# Patient Record
Sex: Male | Born: 1950 | Race: White | Hispanic: No | Marital: Married
Health system: Midwestern US, Academic
[De-identification: ages and names within clinical notes are randomized; demographics above are authoritative.]

---

## 2017-02-19 ENCOUNTER — Encounter: Admit: 2017-02-19 | Discharge: 2017-02-20

## 2017-04-11 ENCOUNTER — Encounter: Admit: 2017-04-11 | Discharge: 2017-04-11

## 2017-04-11 ENCOUNTER — Encounter: Admit: 2017-04-11 | Discharge: 2017-04-12

## 2017-04-11 LAB — COMPREHENSIVE METABOLIC PANEL
Lab: 0.8
Lab: 18 — ABNORMAL HIGH (ref 60–99)
Lab: 29
Lab: 3.7
Lab: 42 — ABNORMAL HIGH (ref 7–39)
Lab: 58 — ABNORMAL LOW (ref >60)
Lab: 59
Lab: 7.5
Lab: 8
Lab: 9
Lab: >60

## 2017-04-11 LAB — MAGNESIUM: Lab: 1.9

## 2017-04-11 LAB — BNP (B-TYPE NATRIURETIC PEPTI)

## 2017-04-11 LAB — TROPONIN-I: Lab: 3.5 — ABNORMAL HIGH (ref 0.00–0.04)

## 2017-04-13 ENCOUNTER — Encounter: Admit: 2017-04-13 | Discharge: 2017-04-13 | Payer: MEDICARE

## 2017-04-13 ENCOUNTER — Inpatient Hospital Stay
Admit: 2017-04-14 | Discharge: 2017-04-15 | Disposition: A | Discharge status: Home / Self Care | Payer: MEDICARE | Source: Other Acute Inpatient Hospital | Attending: Cardiovascular Disease | Admitting: Cardiovascular Disease

## 2017-04-13 DIAGNOSIS — I771 Stricture of artery: ICD-10-CM

## 2017-04-13 DIAGNOSIS — I25110 Atherosclerotic heart disease of native coronary artery with unstable angina pectoris: ICD-10-CM

## 2017-04-13 LAB — CBC
Lab: 11 — ABNORMAL HIGH (ref 4.0–10.8)
Lab: 14 — ABNORMAL HIGH (ref 60–99)
Lab: 33 — ABNORMAL LOW (ref 40.0–50.0)

## 2017-04-13 LAB — BASIC METABOLIC PANEL
Lab: 1.2 — ABNORMAL HIGH (ref 0.40–1.10)
Lab: 136 — ABNORMAL LOW (ref 4.60–6.20)

## 2017-04-13 NOTE — Progress Notes
Hx CAD/bypass  Presented to [facility]/cardiac cath severe disease/interventional cardiologist recommended a specialized stent for his disease and pt's daughter has some connection with [facility] and they requested to be transferred to have procedure done at [facility].   Family request.   Med surg with tele

## 2017-04-14 ENCOUNTER — Encounter: Admit: 2017-04-14 | Discharge: 2017-04-14 | Payer: MEDICARE

## 2017-04-14 DIAGNOSIS — I739 Peripheral vascular disease, unspecified: ICD-10-CM

## 2017-04-14 DIAGNOSIS — I5022 Chronic systolic (congestive) heart failure: ICD-10-CM

## 2017-04-14 DIAGNOSIS — I10 Essential (primary) hypertension: ICD-10-CM

## 2017-04-14 DIAGNOSIS — I2510 Atherosclerotic heart disease of native coronary artery without angina pectoris: ICD-10-CM

## 2017-04-14 DIAGNOSIS — E785 Hyperlipidemia, unspecified: ICD-10-CM

## 2017-04-14 DIAGNOSIS — Z95810 Presence of automatic (implantable) cardiac defibrillator: ICD-10-CM

## 2017-04-14 DIAGNOSIS — I255 Ischemic cardiomyopathy: ICD-10-CM

## 2017-04-14 DIAGNOSIS — I5020 Unspecified systolic (congestive) heart failure: ICD-10-CM

## 2017-04-14 DIAGNOSIS — E119 Type 2 diabetes mellitus without complications: ICD-10-CM

## 2017-04-14 LAB — PTT (APTT)
Lab: 24 s — ABNORMAL LOW (ref 24.0–36.5)
Lab: 32 s (ref 24.0–36.5)

## 2017-04-14 LAB — BNP (B-TYPE NATRIURETIC PEPTI): Lab: 942 pg/mL — ABNORMAL HIGH (ref 0–100)

## 2017-04-14 LAB — PROTIME INR (PT): Lab: 1.2 M/UL — ABNORMAL LOW (ref 0.8–1.2)

## 2017-04-14 LAB — TROPONIN-I
Lab: 1.1 ng/mL — ABNORMAL HIGH (ref 0.0–0.05)
Lab: 0.7 ng/mL — ABNORMAL HIGH (ref 0.0–0.05)
Lab: 0.5 ng/mL — ABNORMAL HIGH (ref 0.0–0.05)

## 2017-04-14 LAB — COMPREHENSIVE METABOLIC PANEL
Lab: 1.1 mg/dL (ref 0.4–1.24)
Lab: 1.3 mg/dL — ABNORMAL HIGH (ref 0.3–1.2)
Lab: 10 10*3/uL — ABNORMAL HIGH (ref 3–12)
Lab: 136 MMOL/L — ABNORMAL LOW (ref 137–147)
Lab: 14 U/L (ref 7–40)
Lab: 21 U/L (ref 7–56)
Lab: 26 MMOL/L — ABNORMAL HIGH (ref 21–30)
Lab: 4 g/dL (ref 3.5–5.0)
Lab: 50 U/L (ref 25–110)
Lab: 7.2 g/dL (ref 6.0–8.0)
Lab: 9.3 mg/dL (ref 8.5–10.6)
Lab: >60 mL/min (ref >60)
Lab: >60 mL/min (ref >60)

## 2017-04-14 LAB — POC GLUCOSE: Lab: 232 mg/dL — ABNORMAL HIGH (ref 70–100)

## 2017-04-14 LAB — CBC AND DIFF: Lab: 11 10*3/uL — ABNORMAL HIGH (ref 4.5–11.0)

## 2017-04-14 LAB — PHOSPHORUS: Lab: 2.6 mg/dL (ref 2.0–4.5)

## 2017-04-14 LAB — MAGNESIUM: Lab: 1.8 mg/dL — ABNORMAL LOW (ref 1.6–2.6)

## 2017-04-14 MED ORDER — MELATONIN 3 MG PO TAB
3 mg | Freq: Every evening | ORAL | 0 refills | Status: DC | PRN
Start: 2017-04-14 — End: 2017-04-16
  Administered 2017-04-14: 08:00:00 3 mg via ORAL

## 2017-04-14 MED ORDER — CLOPIDOGREL 75 MG PO TAB
75 mg | Freq: Every day | ORAL | 0 refills | Status: DC
Start: 2017-04-14 — End: 2017-04-16
  Administered 2017-04-14 – 2017-04-15 (×2): 75 mg via ORAL

## 2017-04-14 MED ORDER — HEPARIN (PORCINE) 1,000 UNIT/ML IJ SOLN
20-40 [IU]/kg | INTRAVENOUS | 0 refills | Status: DC
Start: 2017-04-14 — End: 2017-04-15

## 2017-04-14 MED ORDER — ASPIRIN 81 MG PO CHEW
243 mg | Freq: Once | ORAL | 0 refills | Status: CP
Start: 2017-04-14 — End: ?
  Administered 2017-04-14: 18:00:00 243 mg via ORAL

## 2017-04-14 MED ORDER — ATORVASTATIN 40 MG PO TAB
40 mg | Freq: Every evening | ORAL | 0 refills | Status: DC
Start: 2017-04-14 — End: 2017-04-16
  Administered 2017-04-15: 07:00:00 40 mg via ORAL

## 2017-04-14 MED ORDER — ISOSORBIDE MONONITRATE 30 MG PO TB24
30 mg | Freq: Two times a day (BID) | ORAL | 0 refills | Status: DC
Start: 2017-04-14 — End: 2017-04-16
  Administered 2017-04-14 – 2017-04-15 (×3): 30 mg via ORAL

## 2017-04-14 MED ORDER — ASPIRIN 81 MG PO CHEW
81 mg | Freq: Every day | ORAL | 0 refills | Status: DC
Start: 2017-04-14 — End: 2017-04-16
  Administered 2017-04-14 – 2017-04-15 (×2): 81 mg via ORAL

## 2017-04-14 MED ORDER — AMIODARONE IN DEXTROSE,ISO-OSM 360 MG/200 ML (1.8 MG/ML) IV SOLN
0.5-1 mg/min | INTRAVENOUS | 0 refills | Status: DC
Start: 2017-04-14 — End: 2017-04-15
  Administered 2017-04-14 (×2): 0.5 mg/min via INTRAVENOUS

## 2017-04-14 MED ORDER — CARVEDILOL 12.5 MG PO TAB
12.5 mg | Freq: Two times a day (BID) | ORAL | 0 refills | Status: DC
Start: 2017-04-14 — End: 2017-04-16
  Administered 2017-04-14 – 2017-04-15 (×3): 12.5 mg via ORAL

## 2017-04-14 MED ORDER — FUROSEMIDE 10 MG/ML IJ SOLN
40 mg | Freq: Once | INTRAVENOUS | 0 refills | Status: CP
Start: 2017-04-14 — End: ?
  Administered 2017-04-14: 12:00:00 40 mg via INTRAVENOUS

## 2017-04-14 MED ORDER — ALBUTEROL SULFATE 90 MCG/ACTUATION IN HFAA
2 | RESPIRATORY_TRACT | 0 refills | Status: DC | PRN
Start: 2017-04-14 — End: 2017-04-15
  Administered 2017-04-15: 08:00:00 2 via RESPIRATORY_TRACT

## 2017-04-14 MED ORDER — MAGNESIUM SULFATE IN D5W 1 GRAM/100 ML IV PGBK
1 g | Freq: Once | INTRAVENOUS | 0 refills | Status: CP
Start: 2017-04-14 — End: ?
  Administered 2017-04-14: 13:00:00 1 g via INTRAVENOUS

## 2017-04-14 MED ORDER — HEPARIN (PORCINE) IN 5 % DEX 20,000 UNIT/500 ML (40 UNIT/ML) IV SOLP
0-2000 [IU]/h | INTRAVENOUS | 0 refills | Status: DC
Start: 2017-04-14 — End: 2017-04-15
  Administered 2017-04-14: 08:00:00 1000 [IU]/h via INTRAVENOUS

## 2017-04-14 MED ORDER — INSULIN ASPART 100 UNIT/ML SC FLEXPEN
0-7 [IU] | Freq: Before meals | SUBCUTANEOUS | 0 refills | Status: DC
Start: 2017-04-14 — End: 2017-04-16
  Administered 2017-04-14: 15:00:00 3 [IU] via SUBCUTANEOUS

## 2017-04-14 MED ORDER — POTASSIUM CHLORIDE 20 MEQ PO TBTQ
40 meq | Freq: Once | ORAL | 0 refills | Status: CP
Start: 2017-04-14 — End: ?
  Administered 2017-04-14: 08:00:00 40 meq via ORAL

## 2017-04-14 NOTE — Progress Notes
I have reviewed the notes, assessments, and/or procedures performed by Britney RN and concur with her documentation unless otherwise noted.

## 2017-04-14 NOTE — Progress Notes
pt.off unit in cath.lab

## 2017-04-14 NOTE — Progress Notes
Patient arrived to room # HC404 via cart accompanied by transport. Patient transferred to the bed with assistance. Bedside safety checks completed. Initial patient assessment completed, refer to flowsheet for details. Admission skin assessment completed by:   Marcheta Grammesess Hong Timm, RN  Deatra CanterBrandi Kile, RN    Pressure Injury Present on Hospital Admission (within 24 hours): No    1. Occiput: No  2. Ear: No  3. Scapula: No  4. Spinous Process: No  5. Shoulder: No  6. Elbow: No  7. Iliac Crest: No  8. Sacrum/Coccyx: No  9. Ischial Tuberosity: No  10. Trochanter: No  11. Knee: No  12. Malleolus: No  13. Heel: No  14. Toes: No  15. Assessed for device associated injury Yes  16. Nursing Nutrition Assessment Completed Yes    See Doc Flowsheet for additional wound details.     INTERVENTIONS:     N/A

## 2017-04-14 NOTE — Case Management (ED)
Case Management Progress Note    NAME:Jack Tenny CrawRoss                          MRN: [id number]              DOB:11/06/1950          AGE: 66 y.o.  ADMISSION DATE: 04/14/2017             DAYS ADMITTED: LOS: 0 days      Todays Date: 04/14/2017    Plan  *LHC today    Interventions  ? Support      ? Info or Referral      ? Discharge Planning   Discharge Planning: Other    *Patient was off the floor when this RN CM went to the room to assess the patient. This RN CM will try again later.     ? Medication Needs      ? Financial      ? Legal      ? Other        Disposition  ? Expected Discharge Date       ? Transportation        ? Discharge Disposition           Durable Medical Equipment      No service has been selected for the patient.      [facility] [facility]      No service has been selected for the patient.      [facility]      No service has been selected for the patient.      [facility]      No service has been selected for the patient.        Verdie ShireAnna Jana Swartzlander RN, BSN, MSN  Integrated Nursing Case Manager  Office [phone] M-F 8-5 pm

## 2017-04-14 NOTE — Progress Notes
RT Adult Assessment Note    NAME:William Hurley             MRN: [id number]             DOB:04/15/1951          AGE: 66 y.o.  ADMISSION DATE: 04/14/2017             DAYS ADMITTED: LOS: 0 days    RT Treatment Plan:  Protocol Plan: Medications  Albuterol: MDI PRN         Additional Comments:  Impressions of the patient: Awake,  talking to everyone  Intervention(s)/outcome(s): none  Patient education that was completed: none  Recommendations to the care team: none    Vital Signs:  Pulse: Pulse: 88  RR: Respirations: 16 PER MINUTE  SpO2: SpO2: 97 %  O2 Device:    Liter Flow:    O2%: O2 Percent: 21 %  Breath Sounds: All Breath Sounds: Clear (implies normal)  Respiratory Effort: Respiratory Effort: Non-Labored

## 2017-04-14 NOTE — Progress Notes
Patient arrived on unit via bed accompanied by transport. Patient transferred to the chair without assistance. Frailty score equals 5  Assessment completed, refer to flowsheet for details. Orders released, reviewed, and implemented as appropriate. Oriented to surroundings, call light within reach. Plan of care reviewed.  Will continue to monitor and assess.

## 2017-04-14 NOTE — Progress Notes
Assumed care at 0045.    No acute events overnight.  Assessments completed and documented per flowsheet.    A&Ox4,  VSS per pt trend,  tolerating RA, denies SOA,  V Paced on tele overnight.  Pain well controlled via current regimen.  Denies N/V.  Surgical incisions CDI.  Heparin and Amiodarone gtt infusing per orders (see MAR).  BM PTA,  Adequate UOP overnight.  HFR bundle in place.  Up x1.  Pt uses call light appropriately.  NPO for procedure 04/14/17 and CHG prepped.    No other needs voiced at this time.  Call light within reach.  Will continue to monitor.

## 2017-04-15 ENCOUNTER — Encounter: Admit: 2017-04-15 | Discharge: 2017-04-15 | Payer: MEDICARE

## 2017-04-15 ENCOUNTER — Inpatient Hospital Stay: Admit: 2017-04-14 | Discharge: 2017-04-14 | Payer: MEDICARE

## 2017-04-15 DIAGNOSIS — I214 Non-ST elevation (NSTEMI) myocardial infarction: Principal | ICD-10-CM

## 2017-04-15 DIAGNOSIS — I255 Ischemic cardiomyopathy: Secondary | ICD-10-CM

## 2017-04-15 DIAGNOSIS — I2510 Atherosclerotic heart disease of native coronary artery without angina pectoris: ICD-10-CM

## 2017-04-15 DIAGNOSIS — Z7984 Long term (current) use of oral hypoglycemic drugs: ICD-10-CM

## 2017-04-15 DIAGNOSIS — E1151 Type 2 diabetes mellitus with diabetic peripheral angiopathy without gangrene: ICD-10-CM

## 2017-04-15 DIAGNOSIS — I5023 Acute on chronic systolic (congestive) heart failure: ICD-10-CM

## 2017-04-15 DIAGNOSIS — I472 Ventricular tachycardia: ICD-10-CM

## 2017-04-15 DIAGNOSIS — Z95810 Presence of automatic (implantable) cardiac defibrillator: ICD-10-CM

## 2017-04-15 DIAGNOSIS — E785 Hyperlipidemia, unspecified: ICD-10-CM

## 2017-04-15 DIAGNOSIS — Z951 Presence of aortocoronary bypass graft: ICD-10-CM

## 2017-04-15 DIAGNOSIS — I110 Hypertensive heart disease with heart failure: ICD-10-CM

## 2017-04-15 LAB — POC GLUCOSE
Lab: 126 mg/dL — ABNORMAL HIGH (ref 70–100)
Lab: 173 mg/dL — ABNORMAL HIGH (ref 70–100)

## 2017-04-15 LAB — COMPREHENSIVE METABOLIC PANEL: Lab: 136 MMOL/L — ABNORMAL LOW (ref >60)

## 2017-04-15 LAB — CBC AND DIFF: Lab: 10 K/UL — ABNORMAL LOW (ref 4.5–11.0)

## 2017-04-15 LAB — POC ACTIVATED CLOTTING TIME
Lab: 256 s
Lab: 276 s
Lab: 163 s

## 2017-04-15 MED ORDER — AMIODARONE 200 MG PO TAB
400 mg | Freq: Two times a day (BID) | ORAL | 0 refills | Status: DC
Start: 2017-04-15 — End: 2017-04-16
  Administered 2017-04-15: 14:00:00 400 mg via ORAL

## 2017-04-15 MED ORDER — FUROSEMIDE 10 MG/ML IJ SOLN
40 mg | Freq: Once | INTRAVENOUS | 0 refills | Status: CP
Start: 2017-04-15 — End: ?
  Administered 2017-04-15: 16:00:00 40 mg via INTRAVENOUS

## 2017-04-15 MED ORDER — ATORVASTATIN 40 MG PO TAB
40 mg | ORAL_TABLET | Freq: Every evening | ORAL | 3 refills | Status: CN
Start: 2017-04-15 — End: ?

## 2017-04-15 MED ORDER — AMIODARONE 200 MG PO TAB
ORAL_TABLET | Freq: Two times a day (BID) | ORAL | 0 refills | 42.00000 days | Status: AC
Start: 2017-04-15 — End: 2017-06-23
  Filled 2017-04-15 (×2): qty 90, 30d supply, fill #1

## 2017-04-15 MED ORDER — HEPARIN, PORCINE (PF) 5,000 UNIT/0.5 ML IJ SYRG
5000 [IU] | SUBCUTANEOUS | 0 refills | Status: DC
Start: 2017-04-15 — End: 2017-04-15

## 2017-04-15 MED ORDER — ENOXAPARIN 40 MG/0.4 ML SC SYRG
40 mg | Freq: Every day | SUBCUTANEOUS | 0 refills | Status: DC
Start: 2017-04-15 — End: 2017-04-16

## 2017-04-15 MED ORDER — ALBUTEROL SULFATE 2.5 MG /3 ML (0.083 %) IN NEBU
2.5 mg | RESPIRATORY_TRACT | 0 refills | Status: DC | PRN
Start: 2017-04-15 — End: 2017-04-16

## 2017-04-15 MED ORDER — ATORVASTATIN 40 MG PO TAB
40 mg | ORAL_TABLET | Freq: Every evening | ORAL | 3 refills | Status: AC
Start: 2017-04-15 — End: ?
  Filled 2017-04-15 (×2): qty 90, 90d supply, fill #1

## 2017-04-15 NOTE — Progress Notes
pt. has been down in cath. lab all shift.

## 2017-04-15 NOTE — Progress Notes
On 04/15/2017, William Hurley was counseled and provided with a list of his discharge medications as part of his After Visit Summary.  The patient was instructed to bring this medication list to his next doctor's appointment and to update the list with any medication changes.  Where indicated, the patient was provided with additional medication and/or disease-state information.    All patient questions were answered and patient acknowledged understanding of the medications, side effects, and other pertinent medication information.    Allene Dillonhristopher Pilar Westergaard, [facility]ARMD  04/15/2017

## 2017-04-15 NOTE — Progress Notes
2200 RN into pt room to assess pt. Upon assessment, bleeding noted from groin site. Pt states he had coughed and did not apply pressure to the area while doing so.  Pressure held at groin site by this RN. Consulting civil engineerCharge RN and CTR notified. CTR RN at bedside to assess and hold pressure. 2220, no signs of additional bleeding noted. Site cleaned and new dressing placed. Will restart vitals/assessments and continue to monitor for further bleeding.

## 2017-04-15 NOTE — Case Management (ED)
Case Management Admission Assessment    NAME:William Hurley                          MRN: [id number]             DOB:Sep 20, 1950          AGE: 66 y.o.  ADMISSION DATE: 04/14/2017             DAYS ADMITTED: LOS: 1 day      Today???s Date: 04/15/2017    Source of Information: *patient and significant other Patrica  *Patient hippa compliant with significant other in room during assessment.       Plan  Plan: Case Management Assessment, Assist PRN with SW/NCM Services, Discharge Planning for Home Anticipated   *RNCM met with patient and provided contact information and explanation of CM roles. The patient was encouraged to contact case management with questions and concerns during hospitalization.  *The CM team will follow patient through course of stay and assist with discharge planning.   *Pt does not anticipate any further needs from CM. He denies need for DMEs, assistance paying for prescriptions, PT/OT/ST, and transportation.  *Patient discharging today.    Patient Address/Phone  [address]  [place] [address]  [phone] (home)     Emergency Contact  Extended Emergency Contact Information  Primary Emergency Contact: Kielbasa,Patrica  Mobile Phone: [phone]  Relation: Spouse  Interpreter needed? No  Secondary Emergency Contact: Phillips,Christi  Mobile Phone: [phone]  Relation: Daughter  Interpreter needed? No    Forensic scientist: No, patient does not have a healthcare directive  Would patient like to fill out a (a new) Healthcare Directive?: No, patient declined  Psych Advance Directive (Psych unit only): No, patient does not have a Social research officer, government  Does the patient need discharge transport arranged?: No  Transportation Name, Phone and Availability #1: [employer] [phone]  Does the patient use Medicaid Transportation?: No    Expected Discharge Date  Expected Discharge Date: 04/15/17    Living Situation Prior to Admission  ? Living Arrangements Type of Residence: Home, independent  *Patient drives himself to his appointments.   Living Arrangements: Spouse/significant other(lives with significant other Elease Hashimoto)  Financial risk analyst / Tub: Tub/Shower Unit(grab bars in shower)  How many levels in the residence?: 1  Can patient live on one level if needed?: Yes  Does residence have entry and/or side stairs?: Yes(3 steps into home; patient denies difficulty with stairs)  Assistance needed prior to admit or anticipated on discharge: No  Who provides assistance or could if needed?: significant other Elease Hashimoto  Are they in good health?: Yes  Can support system provide 24/7 care if needed?: Maybe  ? Level of Function   Prior level of function: Independent  ? Cognitive Abilities   Cognitive Abilities: Alert and Oriented, Engages in problem solving and planning, Participates in decision making    Financial Resources  ? Coverage  Primary Insurance: Medicare C[employer]rip[employer] MEDICARE PPO Ph: [phone] )  Secondary Insurance: No insurance  Additional Coverage: RX([id number] MEDICARE PPO Ph: [phone] )    ? Source of Income   Source Of Income: SSI(patient also works part time mowing lawns and shoveling snow)  ? Financial Assistance Needed?  No, patient denies need for assistance.     Psychosocial Needs  ? Mental Health  Mental Health History: No  ? Substance Use History  Substance Use History Screen: No  ?  Other  na    Current/Previous Services  ? PCP  *PCP Melissa Hunttington in [place]   *Cardiologist MD Kennedy Bucker at [facility]    ? Pharmacy    [facility] [address] [place] [address] [place] [address] [place] [address]  [place] [address]  Phone: [phone] Fax: [phone]    ? Durable Medical Equipment   Durable Medical Equipment at home: Nebulizer  ? Home Health  Receiving home health: No  ? Hemodialysis or Peritoneal Dialysis  Undergoing hemodialysis or peritoneal dialysis: No  ? Tube/Enteral Feeds  Receive tube/enteral feeds: No ? Infusion  Receive infusions: No  ? Private Duty  Private duty help used: No  ? Home and Community Based Services  Home and community based services: No  ? [employer]: N/A  ? Hospice  Hospice: No  ? Outpatient Therapy  PT: No  OT: No  SLP: No  ? Skilled Nursing Facility/Nursing Home  NH: No  ? Inpatient Rehab  IPR: No  ? Long-Term Acute Care Hospital  LTACH: No  ? Acute Hospital Stay  Acute Hospital Stay: In the past  Was patient's stay within the last 30 days?: No    Verdie Shire RN, BSN, MSN  Integrated Nursing Case Manager  Office [phone] M-F 8-5 pm

## 2017-04-15 NOTE — Progress Notes
refusing fall bundle, saying "I'm leaving turn this dang thing off, or I'll cut the cord."

## 2017-04-15 NOTE — Progress Notes
1545 - Reviewed discharge instructions, prescriptions/medications, follow-up appt with pt.  Pt states verbal understanding - no questions.  IV and Tele dc'd by pt's RN.  Medications will be delivered to bedside by pharmacy concierge team when ready.  When ready, pt taken to the front door by hospital staff.

## 2017-04-15 NOTE — Progress Notes
RT Adult Assessment Note    NAME:William Hurley             MRN: [id number]             DOB:05/30/1950          AGE: 66 y.o.  ADMISSION DATE: 04/14/2017             DAYS ADMITTED: LOS: 1 day    RT Treatment Plan:  Protocol Plan: Medications  Albuterol: Neb PRN    Protocol Plan: Procedures  IPPB: Place a nursing order for "IS Q1h While Awake" for any of Lung Expansion indicators  Oxygen/Humidity: O2 to keep SpO2 > 92%  Monitoring: Pulse oximetry BID & PRN    Additional Comments:  Impressions of the patient: patient sitting upright on the side of the bed non labored  Intervention(s)/outcome(s): albuterol neb prn,incentive spirometry,02 checks bid >92%  Patient education that was completed: none  Recommendations to the care team: none    Vital Signs:  Pulse: Pulse: 73  RR: Respirations: 16 PER MINUTE  SpO2: SpO2: 92 %  O2 Device:    Liter Flow:    O2%: O2 Percent: 21 %  Breath Sounds:    Respiratory Effort: Respiratory Effort: Non-Labored

## 2017-04-16 ENCOUNTER — Encounter: Admit: 2017-04-16 | Discharge: 2017-04-16 | Payer: MEDICARE

## 2017-04-29 ENCOUNTER — Encounter: Admit: 2017-04-29 | Discharge: 2017-04-29 | Payer: MEDICARE

## 2017-04-29 NOTE — Progress Notes
Records Request    Medical records request for continuation of care:    Patient has appointment on 05/14/17   with  Dr. Danella MaiersSteven Owens* .    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

## 2017-05-01 ENCOUNTER — Encounter: Admit: 2017-05-01 | Discharge: 2017-05-01 | Payer: MEDICARE

## 2017-05-01 DIAGNOSIS — Z95810 Presence of automatic (implantable) cardiac defibrillator: ICD-10-CM

## 2017-05-01 DIAGNOSIS — I255 Ischemic cardiomyopathy: ICD-10-CM

## 2017-05-01 DIAGNOSIS — I2510 Atherosclerotic heart disease of native coronary artery without angina pectoris: Principal | ICD-10-CM

## 2017-05-01 DIAGNOSIS — E119 Type 2 diabetes mellitus without complications: Secondary | ICD-10-CM

## 2017-05-01 DIAGNOSIS — I739 Peripheral vascular disease, unspecified: ICD-10-CM

## 2017-05-01 DIAGNOSIS — I5022 Chronic systolic (congestive) heart failure: ICD-10-CM

## 2017-05-01 DIAGNOSIS — I10 Essential (primary) hypertension: ICD-10-CM

## 2017-05-01 DIAGNOSIS — I5020 Unspecified systolic (congestive) heart failure: ICD-10-CM

## 2017-05-01 DIAGNOSIS — E785 Hyperlipidemia, unspecified: ICD-10-CM

## 2017-05-04 ENCOUNTER — Encounter: Admit: 2017-05-04 | Discharge: 2017-05-04 | Payer: MEDICARE

## 2017-05-14 ENCOUNTER — Ambulatory Visit: Admit: 2017-05-14 | Discharge: 2017-05-15 | Payer: MEDICARE

## 2017-05-14 ENCOUNTER — Encounter: Admit: 2017-05-14 | Discharge: 2017-05-14 | Payer: MEDICARE

## 2017-05-14 DIAGNOSIS — I5020 Unspecified systolic (congestive) heart failure: ICD-10-CM

## 2017-05-14 DIAGNOSIS — I739 Peripheral vascular disease, unspecified: Principal | ICD-10-CM

## 2017-05-14 DIAGNOSIS — I25110 Atherosclerotic heart disease of native coronary artery with unstable angina pectoris: ICD-10-CM

## 2017-05-14 DIAGNOSIS — Z95810 Presence of automatic (implantable) cardiac defibrillator: ICD-10-CM

## 2017-05-14 DIAGNOSIS — E782 Mixed hyperlipidemia: ICD-10-CM

## 2017-05-14 DIAGNOSIS — I10 Essential (primary) hypertension: ICD-10-CM

## 2017-05-14 DIAGNOSIS — I771 Stricture of artery: ICD-10-CM

## 2017-05-14 DIAGNOSIS — Z87891 Personal history of nicotine dependence: ICD-10-CM

## 2017-05-14 DIAGNOSIS — I5022 Chronic systolic (congestive) heart failure: ICD-10-CM

## 2017-05-14 DIAGNOSIS — E118 Type 2 diabetes mellitus with unspecified complications: ICD-10-CM

## 2017-05-14 DIAGNOSIS — I2510 Atherosclerotic heart disease of native coronary artery without angina pectoris: Secondary | ICD-10-CM

## 2017-05-14 DIAGNOSIS — E119 Type 2 diabetes mellitus without complications: Secondary | ICD-10-CM

## 2017-05-14 DIAGNOSIS — J431 Panlobular emphysema: ICD-10-CM

## 2017-05-14 DIAGNOSIS — E785 Hyperlipidemia, unspecified: ICD-10-CM

## 2017-05-14 DIAGNOSIS — I255 Ischemic cardiomyopathy: ICD-10-CM

## 2017-05-27 LAB — COMPREHENSIVE METABOLIC PANEL
Lab: 138 — ABNORMAL LOW (ref 4.60–6.20)
Lab: >60

## 2017-05-27 LAB — CBC: Lab: 8.6

## 2017-05-27 LAB — LIPASE: Lab: 258

## 2017-05-28 ENCOUNTER — Encounter: Admit: 2017-05-28 | Discharge: 2017-05-28 | Payer: MEDICARE

## 2017-05-28 LAB — BASIC METABOLIC PANEL
Lab: 6 — ABNORMAL LOW (ref >60)
Lab: >60
Lab: 140
Lab: 55 — ABNORMAL LOW (ref >60)

## 2017-05-28 LAB — LIPID PROFILE
Lab: 136 — ABNORMAL HIGH (ref 90–129)
Lab: 164 — ABNORMAL HIGH (ref 0.40–1.10)
Lab: 260 — ABNORMAL HIGH (ref 30–150)
Lab: 28 — ABNORMAL LOW (ref 40–60)
Lab: 5.9 — ABNORMAL HIGH (ref 0.0–5.0)
Lab: 84

## 2017-05-28 LAB — MAGNESIUM: Lab: 1.9

## 2017-05-28 LAB — TROPONIN-I

## 2017-06-02 ENCOUNTER — Ambulatory Visit: Admit: 2017-06-02 | Discharge: 2017-06-03 | Payer: MEDICARE

## 2017-06-02 ENCOUNTER — Encounter: Admit: 2017-06-02 | Discharge: 2017-06-02 | Payer: MEDICARE

## 2017-06-02 DIAGNOSIS — I739 Peripheral vascular disease, unspecified: Secondary | ICD-10-CM

## 2017-06-02 DIAGNOSIS — I10 Essential (primary) hypertension: ICD-10-CM

## 2017-06-02 DIAGNOSIS — E119 Type 2 diabetes mellitus without complications: Secondary | ICD-10-CM

## 2017-06-02 DIAGNOSIS — I255 Ischemic cardiomyopathy: ICD-10-CM

## 2017-06-02 DIAGNOSIS — Z95810 Presence of automatic (implantable) cardiac defibrillator: ICD-10-CM

## 2017-06-02 DIAGNOSIS — I2510 Atherosclerotic heart disease of native coronary artery without angina pectoris: Principal | ICD-10-CM

## 2017-06-02 DIAGNOSIS — I5020 Unspecified systolic (congestive) heart failure: ICD-10-CM

## 2017-06-02 DIAGNOSIS — I25110 Atherosclerotic heart disease of native coronary artery with unstable angina pectoris: Principal | ICD-10-CM

## 2017-06-02 DIAGNOSIS — E785 Hyperlipidemia, unspecified: ICD-10-CM

## 2017-06-02 DIAGNOSIS — I5022 Chronic systolic (congestive) heart failure: ICD-10-CM

## 2017-06-02 MED ORDER — ISOSORBIDE MONONITRATE 30 MG PO TB24
60 mg | ORAL_TABLET | Freq: Every day | ORAL | 0 refills | 90.00000 days | Status: AC
Start: 2017-06-02 — End: 2018-09-02

## 2017-06-04 ENCOUNTER — Encounter: Admit: 2017-06-04 | Discharge: 2017-06-04

## 2017-06-04 ENCOUNTER — Encounter: Admit: 2017-06-04 | Discharge: 2017-06-04 | Payer: MEDICARE

## 2017-06-04 ENCOUNTER — Inpatient Hospital Stay
Admit: 2017-06-04 | Discharge: 2017-06-06 | Disposition: A | Discharge status: Home / Self Care | Payer: MEDICARE | Source: Other Acute Inpatient Hospital

## 2017-06-04 DIAGNOSIS — R69 Illness, unspecified: Principal | ICD-10-CM

## 2017-06-04 DIAGNOSIS — R509 Fever, unspecified: Secondary | ICD-10-CM

## 2017-06-04 LAB — BNP (B-TYPE NATRIURETIC PEPTI): Lab: 100 pg/mL — ABNORMAL HIGH (ref 0–100)

## 2017-06-04 LAB — URINALYSIS DIPSTICK
Lab: NEGATIVE 10*3/uL (ref 0–0.45)
Lab: NEGATIVE mL/min (ref 1.0–4.8)
Lab: NEGATIVE mL/min — ABNORMAL HIGH (ref 0–0.80)

## 2017-06-04 LAB — COMPREHENSIVE METABOLIC PANEL
Lab: 1.2 mg/dL (ref 0.4–1.24)
Lab: 10 K/UL — ABNORMAL HIGH (ref 3–12)
Lab: 17 U/L (ref 7–40)
Lab: 18 mg/dL (ref 7–25)
Lab: 9.6 mg/dL — ABNORMAL HIGH (ref 8.5–10.6)

## 2017-06-04 LAB — PHOSPHORUS: Lab: 3.7 mg/dL — ABNORMAL LOW (ref 2.0–4.5)

## 2017-06-04 LAB — MAGNESIUM: Lab: 1.5 mg/dL — ABNORMAL LOW (ref 1.6–2.6)

## 2017-06-04 LAB — CBC AND DIFF
Lab: 4.4 M/UL (ref 4.4–5.5)
Lab: 9.9 10*3/uL (ref 4.5–11.0)

## 2017-06-04 LAB — URINALYSIS, MICROSCOPIC

## 2017-06-04 LAB — TSH WITH FREE T4 REFLEX: Lab: 3.2 uU/mL (ref 0.35–5.00)

## 2017-06-04 MED ORDER — ENOXAPARIN 40 MG/0.4 ML SC SYRG
40 mg | Freq: Every day | SUBCUTANEOUS | 0 refills | Status: DC
Start: 2017-06-04 — End: 2017-06-06
  Administered 2017-06-05: 03:00:00 40 mg via SUBCUTANEOUS

## 2017-06-04 MED ORDER — AMIODARONE 200 MG PO TAB
200 mg | Freq: Two times a day (BID) | ORAL | 0 refills | Status: DC
Start: 2017-06-04 — End: 2017-06-06
  Administered 2017-06-05 (×2): 200 mg via ORAL

## 2017-06-04 MED ORDER — FLUTICASONE 50 MCG/ACTUATION NA SPSN
2 | Freq: Every day | NASAL | 0 refills | Status: DC | PRN
Start: 2017-06-04 — End: 2017-06-06

## 2017-06-04 MED ORDER — SODIUM CHLORIDE 0.9 % IJ SOLN
50 mL | Freq: Once | INTRAVENOUS | 0 refills | Status: CP
Start: 2017-06-04 — End: ?
  Administered 2017-06-05: 50 mL via INTRAVENOUS

## 2017-06-04 MED ORDER — DOXAZOSIN 4 MG PO TAB
4 mg | Freq: Every day | ORAL | 0 refills | Status: DC
Start: 2017-06-04 — End: 2017-06-06
  Administered 2017-06-05: 03:00:00 4 mg via ORAL

## 2017-06-04 MED ORDER — ATORVASTATIN 40 MG PO TAB
40 mg | Freq: Every evening | ORAL | 0 refills | Status: DC
Start: 2017-06-04 — End: 2017-06-06
  Administered 2017-06-05: 03:00:00 40 mg via ORAL

## 2017-06-04 MED ORDER — ISOSORBIDE MONONITRATE 30 MG PO TB24
60 mg | Freq: Every day | ORAL | 0 refills | Status: DC
Start: 2017-06-04 — End: 2017-06-06
  Administered 2017-06-05 (×2): 60 mg via ORAL

## 2017-06-04 MED ORDER — LOSARTAN 50 MG PO TAB
100 mg | Freq: Every day | ORAL | 0 refills | Status: DC
Start: 2017-06-04 — End: 2017-06-06
  Administered 2017-06-05: 15:00:00 100 mg via ORAL

## 2017-06-04 MED ORDER — FUROSEMIDE 40 MG PO TAB
40 mg | Freq: Two times a day (BID) | ORAL | 0 refills | Status: DC
Start: 2017-06-04 — End: 2017-06-06
  Administered 2017-06-05 (×2): 40 mg via ORAL

## 2017-06-04 MED ORDER — IOHEXOL 350 MG IODINE/ML IV SOLN
60 mL | Freq: Once | INTRAVENOUS | 0 refills | Status: CP
Start: 2017-06-04 — End: ?
  Administered 2017-06-05: 60 mL via INTRAVENOUS

## 2017-06-04 MED ORDER — ASPIRIN 81 MG PO TBEC
81 mg | Freq: Every day | ORAL | 0 refills | Status: DC
Start: 2017-06-04 — End: 2017-06-06
  Administered 2017-06-05 (×2): 81 mg via ORAL

## 2017-06-04 MED ORDER — CLOPIDOGREL 75 MG PO TAB
75 mg | Freq: Every day | ORAL | 0 refills | Status: DC
Start: 2017-06-04 — End: 2017-06-06
  Administered 2017-06-05 (×2): 75 mg via ORAL

## 2017-06-04 MED ORDER — HYDROCODONE-ACETAMINOPHEN 5-325 MG PO TAB
1 | ORAL | 0 refills | Status: DC | PRN
Start: 2017-06-04 — End: 2017-06-06

## 2017-06-04 MED ORDER — CARVEDILOL 12.5 MG PO TAB
12.5 mg | Freq: Two times a day (BID) | ORAL | 0 refills | Status: DC
Start: 2017-06-04 — End: 2017-06-06
  Administered 2017-06-05 (×2): 12.5 mg via ORAL

## 2017-06-05 LAB — LEGIONELLA ANTIGEN URINE,RAN: Lab: NEGATIVE

## 2017-06-05 LAB — STREPTOCOCCUS PNEUMO AG, URINE: Lab: NEGATIVE

## 2017-06-05 LAB — TROPONIN-I
Lab: 0 ng/mL — ABNORMAL HIGH (ref 0.0–0.05)
Lab: 0 ng/mL — ABNORMAL HIGH (ref 0.0–0.05)
Lab: 0 ng/mL (ref 0.0–0.05)

## 2017-06-05 LAB — CULTURE-URINE W/SENSITIVITY

## 2017-06-05 LAB — COMPREHENSIVE METABOLIC PANEL: Lab: 125 mg/dL — ABNORMAL HIGH (ref >60)

## 2017-06-05 LAB — PROCALCITONIN: Lab: 0.3 ng/mL — ABNORMAL HIGH (ref <0.10)

## 2017-06-05 LAB — MAGNESIUM: Lab: 1.6 mg/dL (ref 1.6–2.6)

## 2017-06-05 LAB — CBC AND DIFF
Lab: 0 K/UL — ABNORMAL HIGH (ref 0–0.20)
Lab: 3.8 M/UL — ABNORMAL LOW (ref >60)

## 2017-06-05 MED ORDER — PNEUMOCOCCAL 23-VAL PS VACCINE 25 MCG/0.5 ML IJ SOLN
.5 mL | Freq: Once | INTRAMUSCULAR | 0 refills | Status: DC
Start: 2017-06-05 — End: 2017-06-06

## 2017-06-05 MED ORDER — POTASSIUM CHLORIDE 20 MEQ PO TBTQ
60 meq | Freq: Once | ORAL | 0 refills | Status: CP
Start: 2017-06-05 — End: ?
  Administered 2017-06-05: 15:00:00 60 meq via ORAL

## 2017-06-05 MED ORDER — PERFLUTREN LIPID MICROSPHERES 1.1 MG/ML IV SUSP
1-20 mL | Freq: Once | INTRAVENOUS | 0 refills | Status: CP
Start: 2017-06-05 — End: ?
  Administered 2017-06-05: 21:00:00 2 mL via INTRAVENOUS

## 2017-06-05 MED ORDER — FLU VACC TS2018-19 65YR UP(PF) 180 MCG/0.5 ML IM SYRG
.5 mL | Freq: Once | INTRAMUSCULAR | 0 refills | Status: DC
Start: 2017-06-05 — End: 2017-06-05

## 2017-06-05 MED ORDER — SPIRONOLACTONE 25 MG PO TAB
12.5 mg | ORAL_TABLET | Freq: Every day | ORAL | 3 refills | 90.00000 days | Status: AC
Start: 2017-06-05 — End: 2017-08-04

## 2017-06-05 MED ORDER — SPIRONOLACTONE 25 MG PO TAB
12.5 mg | Freq: Every day | ORAL | 0 refills | Status: DC
Start: 2017-06-05 — End: 2017-06-06
  Administered 2017-06-05: 19:00:00 12.5 mg via ORAL

## 2017-06-05 MED ORDER — MAGNESIUM SULFATE IN D5W 1 GRAM/100 ML IV PGBK
1 g | Freq: Once | INTRAVENOUS | 0 refills | Status: CP
Start: 2017-06-05 — End: ?
  Administered 2017-06-05: 21:00:00 1 g via INTRAVENOUS

## 2017-06-06 ENCOUNTER — Inpatient Hospital Stay: Admit: 2017-06-05 | Discharge: 2017-06-05 | Payer: MEDICARE

## 2017-06-06 ENCOUNTER — Inpatient Hospital Stay: Admit: 2017-06-04 | Discharge: 2017-06-04 | Payer: MEDICARE

## 2017-06-06 DIAGNOSIS — Z95810 Presence of automatic (implantable) cardiac defibrillator: ICD-10-CM

## 2017-06-06 DIAGNOSIS — I255 Ischemic cardiomyopathy: ICD-10-CM

## 2017-06-06 DIAGNOSIS — I2510 Atherosclerotic heart disease of native coronary artery without angina pectoris: Principal | ICD-10-CM

## 2017-06-06 DIAGNOSIS — Z95820 Peripheral vascular angioplasty status with implants and grafts: ICD-10-CM

## 2017-06-06 DIAGNOSIS — Z87891 Personal history of nicotine dependence: ICD-10-CM

## 2017-06-06 DIAGNOSIS — K5909 Other constipation: ICD-10-CM

## 2017-06-06 DIAGNOSIS — I5022 Chronic systolic (congestive) heart failure: ICD-10-CM

## 2017-06-06 DIAGNOSIS — Z7984 Long term (current) use of oral hypoglycemic drugs: ICD-10-CM

## 2017-06-06 DIAGNOSIS — E1151 Type 2 diabetes mellitus with diabetic peripheral angiopathy without gangrene: ICD-10-CM

## 2017-06-06 DIAGNOSIS — Z951 Presence of aortocoronary bypass graft: ICD-10-CM

## 2017-06-06 DIAGNOSIS — I110 Hypertensive heart disease with heart failure: ICD-10-CM

## 2017-06-06 DIAGNOSIS — E785 Hyperlipidemia, unspecified: ICD-10-CM

## 2017-06-06 DIAGNOSIS — I2582 Chronic total occlusion of coronary artery: ICD-10-CM

## 2017-06-06 DIAGNOSIS — J431 Panlobular emphysema: ICD-10-CM

## 2017-06-06 DIAGNOSIS — R509 Fever, unspecified: ICD-10-CM

## 2017-06-08 ENCOUNTER — Encounter: Admit: 2017-06-08 | Discharge: 2017-06-08 | Payer: MEDICARE

## 2017-06-08 DIAGNOSIS — Z79899 Other long term (current) drug therapy: ICD-10-CM

## 2017-06-08 DIAGNOSIS — I25708 Atherosclerosis of coronary artery bypass graft(s), unspecified, with other forms of angina pectoris: ICD-10-CM

## 2017-06-08 DIAGNOSIS — Z01812 Encounter for preprocedural laboratory examination: ICD-10-CM

## 2017-06-08 DIAGNOSIS — R69 Illness, unspecified: Principal | ICD-10-CM

## 2017-06-08 DIAGNOSIS — I25110 Atherosclerotic heart disease of native coronary artery with unstable angina pectoris: Principal | ICD-10-CM

## 2017-06-08 MED ORDER — CLOPIDOGREL 75 MG PO TAB
75 mg | Freq: Every day | ORAL | 0 refills | Status: CN
Start: 2017-06-08 — End: ?

## 2017-06-08 MED ORDER — ASPIRIN 325 MG PO TAB
325 mg | Freq: Once | ORAL | 0 refills | Status: CN
Start: 2017-06-08 — End: ?

## 2017-06-08 MED ORDER — SODIUM CHLORIDE 0.9 % IV SOLP
INTRAVENOUS | 0 refills | Status: CN
Start: 2017-06-08 — End: ?

## 2017-06-08 MED ORDER — FUROSEMIDE 40 MG PO TAB
40 mg | ORAL_TABLET | Freq: Two times a day (BID) | ORAL | 1 refills | Status: SS
Start: 2017-06-08 — End: 2017-06-17

## 2017-06-10 LAB — CULTURE-BLOOD W/SENSITIVITY

## 2017-06-12 ENCOUNTER — Encounter: Admit: 2017-06-12 | Discharge: 2017-06-12 | Payer: MEDICARE

## 2017-06-12 DIAGNOSIS — I25110 Atherosclerotic heart disease of native coronary artery with unstable angina pectoris: Principal | ICD-10-CM

## 2017-06-12 MED ORDER — MAGNESIUM HYDROXIDE 2,400 MG/10 ML PO SUSP
10 mL | ORAL | 0 refills | Status: CN | PRN
Start: 2017-06-12 — End: ?

## 2017-06-12 MED ORDER — SODIUM CHLORIDE 0.9 % IV SOLP
INTRAVENOUS | 0 refills | Status: CN
Start: 2017-06-12 — End: ?

## 2017-06-12 MED ORDER — ACETAMINOPHEN 325 MG PO TAB
650 mg | ORAL | 0 refills | Status: CN | PRN
Start: 2017-06-12 — End: ?

## 2017-06-12 MED ORDER — TEMAZEPAM 15 MG PO CAP
15 mg | Freq: Every evening | ORAL | 0 refills | Status: CN | PRN
Start: 2017-06-12 — End: ?

## 2017-06-12 MED ORDER — ALUMINUM-MAGNESIUM HYDROXIDE 200-200 MG/5 ML PO SUSP
30 mL | ORAL | 0 refills | Status: CN | PRN
Start: 2017-06-12 — End: ?

## 2017-06-17 ENCOUNTER — Encounter: Admit: 2017-06-17 | Discharge: 2017-06-17 | Payer: MEDICARE

## 2017-06-17 ENCOUNTER — Ambulatory Visit: Admit: 2017-06-17 | Discharge: 2017-06-17 | Payer: MEDICARE

## 2017-06-17 DIAGNOSIS — R7989 Other specified abnormal findings of blood chemistry: ICD-10-CM

## 2017-06-17 DIAGNOSIS — I5020 Unspecified systolic (congestive) heart failure: ICD-10-CM

## 2017-06-17 DIAGNOSIS — N179 Acute kidney failure, unspecified: ICD-10-CM

## 2017-06-17 DIAGNOSIS — I255 Ischemic cardiomyopathy: ICD-10-CM

## 2017-06-17 DIAGNOSIS — Z95810 Presence of automatic (implantable) cardiac defibrillator: ICD-10-CM

## 2017-06-17 DIAGNOSIS — E119 Type 2 diabetes mellitus without complications: Secondary | ICD-10-CM

## 2017-06-17 DIAGNOSIS — I739 Peripheral vascular disease, unspecified: Secondary | ICD-10-CM

## 2017-06-17 DIAGNOSIS — E785 Hyperlipidemia, unspecified: ICD-10-CM

## 2017-06-17 DIAGNOSIS — I5022 Chronic systolic (congestive) heart failure: ICD-10-CM

## 2017-06-17 DIAGNOSIS — I2510 Atherosclerotic heart disease of native coronary artery without angina pectoris: Secondary | ICD-10-CM

## 2017-06-17 DIAGNOSIS — E118 Type 2 diabetes mellitus with unspecified complications: ICD-10-CM

## 2017-06-17 DIAGNOSIS — I10 Essential (primary) hypertension: ICD-10-CM

## 2017-06-17 DIAGNOSIS — Z538 Procedure and treatment not carried out for other reasons: ICD-10-CM

## 2017-06-17 DIAGNOSIS — Z794 Long term (current) use of insulin: ICD-10-CM

## 2017-06-17 LAB — BASIC METABOLIC PANEL
Lab: 1.7 mg/dL — ABNORMAL HIGH (ref 0.4–1.24)
Lab: 102 MMOL/L — ABNORMAL LOW (ref 98–110)
Lab: 113 mg/dL — ABNORMAL HIGH (ref 70–100)
Lab: 137 MMOL/L — ABNORMAL LOW (ref 137–147)
Lab: 23 mg/dL — ABNORMAL HIGH (ref 7–25)
Lab: 28 MMOL/L (ref 21–30)
Lab: 4.5 MMOL/L — ABNORMAL LOW (ref 3.5–5.1)
Lab: 41 mL/min — ABNORMAL LOW (ref >60)
Lab: 49 mL/min — ABNORMAL LOW (ref >60)
Lab: 7 pg (ref 3–12)
Lab: 9.5 mg/dL (ref 8.5–10.6)

## 2017-06-17 LAB — CBC: Lab: 6.2 10*3/uL (ref 4.5–11.0)

## 2017-06-17 LAB — POC CREATININE, RAD: Lab: 1.5 mg/dL — ABNORMAL HIGH (ref 0.4–1.24)

## 2017-06-17 MED ORDER — ACETAMINOPHEN 325 MG PO TAB
650 mg | ORAL | 0 refills | Status: DC | PRN
Start: 2017-06-17 — End: 2017-06-17

## 2017-06-17 MED ORDER — ASPIRIN 325 MG PO TAB
325 mg | Freq: Once | ORAL | 0 refills | Status: DC
Start: 2017-06-17 — End: 2017-06-17

## 2017-06-17 MED ORDER — TEMAZEPAM 15 MG PO CAP
15 mg | Freq: Every evening | ORAL | 0 refills | Status: DC | PRN
Start: 2017-06-17 — End: 2017-06-17

## 2017-06-17 MED ORDER — SODIUM CHLORIDE 0.9 % IV SOLP
INTRAVENOUS | 0 refills | Status: DC
Start: 2017-06-17 — End: 2017-06-17
  Administered 2017-06-17: 15:00:00 1000.000 mL via INTRAVENOUS

## 2017-06-17 MED ORDER — SODIUM CHLORIDE 0.9 % IV SOLP
INTRAVENOUS | 0 refills | Status: DC
Start: 2017-06-17 — End: 2017-06-17

## 2017-06-17 MED ORDER — FUROSEMIDE 40 MG PO TAB
40 mg | ORAL_TABLET | Freq: Every morning | ORAL | 1 refills | 90.00000 days | Status: AC
Start: 2017-06-17 — End: 2018-05-27

## 2017-06-17 MED ORDER — MAGNESIUM HYDROXIDE 2,400 MG/10 ML PO SUSP
10 mL | ORAL | 0 refills | Status: DC | PRN
Start: 2017-06-17 — End: 2017-06-17

## 2017-06-17 MED ORDER — ALUMINUM-MAGNESIUM HYDROXIDE 200-200 MG/5 ML PO SUSP
30 mL | ORAL | 0 refills | Status: DC | PRN
Start: 2017-06-17 — End: 2017-06-17

## 2017-06-17 MED ORDER — SODIUM CHLORIDE 0.9 % IV SOLP
250 mL | INTRAVENOUS | 0 refills | Status: CP
Start: 2017-06-17 — End: ?
  Administered 2017-06-17: 16:00:00 250 mL via INTRAVENOUS

## 2017-06-17 MED ORDER — CLOPIDOGREL 75 MG PO TAB
75 mg | Freq: Every day | ORAL | 0 refills | Status: DC
Start: 2017-06-17 — End: 2017-06-17

## 2017-06-22 LAB — BASIC METABOLIC PANEL
Lab: 1.5 — ABNORMAL HIGH (ref 0.72–1.25)
Lab: 101
Lab: 107
Lab: 139
Lab: 16 — ABNORMAL HIGH (ref 0–14)
Lab: 22
Lab: 26
Lab: 4.3
Lab: 48
Lab: 9.9

## 2017-06-23 ENCOUNTER — Encounter: Admit: 2017-06-23 | Discharge: 2017-06-23 | Payer: MEDICARE

## 2017-06-23 MED ORDER — AMIODARONE 200 MG PO TAB
200 mg | ORAL_TABLET | Freq: Two times a day (BID) | ORAL | 3 refills | 42.00000 days | Status: AC
Start: 2017-06-23 — End: 2018-08-25

## 2017-06-24 ENCOUNTER — Encounter: Admit: 2017-06-24 | Discharge: 2017-06-24 | Payer: MEDICARE

## 2017-06-24 DIAGNOSIS — N179 Acute kidney failure, unspecified: ICD-10-CM

## 2017-06-24 DIAGNOSIS — I5022 Chronic systolic (congestive) heart failure: ICD-10-CM

## 2017-06-24 DIAGNOSIS — E118 Type 2 diabetes mellitus with unspecified complications: ICD-10-CM

## 2017-06-24 DIAGNOSIS — Z95810 Presence of automatic (implantable) cardiac defibrillator: ICD-10-CM

## 2017-06-24 DIAGNOSIS — I255 Ischemic cardiomyopathy: ICD-10-CM

## 2017-06-24 DIAGNOSIS — I25110 Atherosclerotic heart disease of native coronary artery with unstable angina pectoris: Principal | ICD-10-CM

## 2017-07-01 ENCOUNTER — Encounter: Admit: 2017-07-01 | Discharge: 2017-07-01 | Payer: MEDICARE

## 2017-07-01 DIAGNOSIS — I10 Essential (primary) hypertension: Principal | ICD-10-CM

## 2017-07-01 DIAGNOSIS — I5022 Chronic systolic (congestive) heart failure: ICD-10-CM

## 2017-07-09 ENCOUNTER — Ambulatory Visit: Admit: 2017-07-09 | Discharge: 2017-07-10 | Payer: MEDICARE

## 2017-07-09 ENCOUNTER — Encounter: Admit: 2017-07-09 | Discharge: 2017-07-09 | Payer: MEDICARE

## 2017-07-09 DIAGNOSIS — I5022 Chronic systolic (congestive) heart failure: ICD-10-CM

## 2017-07-09 DIAGNOSIS — E785 Hyperlipidemia, unspecified: ICD-10-CM

## 2017-07-09 DIAGNOSIS — E119 Type 2 diabetes mellitus without complications: ICD-10-CM

## 2017-07-09 DIAGNOSIS — I2510 Atherosclerotic heart disease of native coronary artery without angina pectoris: Secondary | ICD-10-CM

## 2017-07-09 DIAGNOSIS — I25110 Atherosclerotic heart disease of native coronary artery with unstable angina pectoris: ICD-10-CM

## 2017-07-09 DIAGNOSIS — Z95810 Presence of automatic (implantable) cardiac defibrillator: ICD-10-CM

## 2017-07-09 DIAGNOSIS — I10 Essential (primary) hypertension: ICD-10-CM

## 2017-07-09 DIAGNOSIS — I255 Ischemic cardiomyopathy: ICD-10-CM

## 2017-07-09 DIAGNOSIS — I739 Peripheral vascular disease, unspecified: ICD-10-CM

## 2017-07-09 DIAGNOSIS — I5020 Unspecified systolic (congestive) heart failure: ICD-10-CM

## 2017-07-09 DIAGNOSIS — E782 Mixed hyperlipidemia: ICD-10-CM

## 2017-07-09 LAB — BASIC METABOLIC PANEL
Lab: 1.6 — ABNORMAL HIGH (ref 0.72–1.25)
Lab: 4.5
Lab: 45
Lab: 104
Lab: 109
Lab: 142
Lab: 17 — ABNORMAL HIGH (ref 0–14)
Lab: 19
Lab: 26
Lab: 9.4

## 2017-07-09 LAB — LIPID PROFILE
Lab: 184 — ABNORMAL LOW (ref 4.70–6.10)
Lab: 346 — ABNORMAL HIGH (ref 30–200)

## 2017-07-09 LAB — CBC: Lab: 5.8

## 2017-07-09 LAB — PROSTATIC SPECIFIC ANTIGEN-PSA: Lab: 0.2 — ABNORMAL LOW (ref 35–60)

## 2017-07-09 MED ORDER — TELMISARTAN 80 MG PO TAB
40 mg | Freq: Every day | ORAL | 0 refills | 90.00000 days | Status: AC
Start: 2017-07-09 — End: 2018-09-02

## 2017-07-09 MED ORDER — NITROGLYCERIN 0.4 MG SL SUBL
.4 mg | ORAL_TABLET | SUBLINGUAL | 3 refills | 9.00000 days | Status: AC | PRN
Start: 2017-07-09 — End: ?

## 2017-07-09 MED ORDER — CARVEDILOL 6.25 MG PO TAB
6.25 mg | ORAL_TABLET | Freq: Two times a day (BID) | ORAL | 3 refills | 90.00000 days | Status: AC
Start: 2017-07-09 — End: 2018-12-02

## 2017-07-20 ENCOUNTER — Encounter: Admit: 2017-07-20 | Discharge: 2017-07-20 | Payer: MEDICARE

## 2017-07-20 DIAGNOSIS — I255 Ischemic cardiomyopathy: Principal | ICD-10-CM

## 2017-07-20 DIAGNOSIS — Z79899 Other long term (current) drug therapy: ICD-10-CM

## 2017-07-26 LAB — COMPREHENSIVE METABOLIC PANEL
Lab: 136
Lab: 15
Lab: 20 — ABNORMAL HIGH (ref 0–14)
Lab: 30
Lab: 36

## 2017-08-04 ENCOUNTER — Ambulatory Visit: Admit: 2017-08-04 | Discharge: 2017-08-04 | Payer: MEDICARE

## 2017-08-04 ENCOUNTER — Encounter: Admit: 2017-08-04 | Discharge: 2017-08-04 | Payer: MEDICARE

## 2017-08-04 DIAGNOSIS — I10 Essential (primary) hypertension: ICD-10-CM

## 2017-08-04 DIAGNOSIS — E118 Type 2 diabetes mellitus with unspecified complications: ICD-10-CM

## 2017-08-04 DIAGNOSIS — I739 Peripheral vascular disease, unspecified: ICD-10-CM

## 2017-08-04 DIAGNOSIS — I25110 Atherosclerotic heart disease of native coronary artery with unstable angina pectoris: ICD-10-CM

## 2017-08-04 DIAGNOSIS — E119 Type 2 diabetes mellitus without complications: Secondary | ICD-10-CM

## 2017-08-04 DIAGNOSIS — E785 Hyperlipidemia, unspecified: ICD-10-CM

## 2017-08-04 DIAGNOSIS — I5022 Chronic systolic (congestive) heart failure: ICD-10-CM

## 2017-08-04 DIAGNOSIS — I255 Ischemic cardiomyopathy: ICD-10-CM

## 2017-08-04 DIAGNOSIS — I5020 Unspecified systolic (congestive) heart failure: ICD-10-CM

## 2017-08-04 DIAGNOSIS — N179 Acute kidney failure, unspecified: Principal | ICD-10-CM

## 2017-08-04 DIAGNOSIS — I2510 Atherosclerotic heart disease of native coronary artery without angina pectoris: Principal | ICD-10-CM

## 2017-08-04 DIAGNOSIS — Z95810 Presence of automatic (implantable) cardiac defibrillator: ICD-10-CM

## 2017-08-04 DIAGNOSIS — E782 Mixed hyperlipidemia: ICD-10-CM

## 2017-08-05 ENCOUNTER — Encounter: Admit: 2017-08-05 | Discharge: 2017-08-06 | Payer: MEDICARE

## 2017-08-05 LAB — PROTEIN/CR RATIO,UR RAN
Lab: 0.3 mg/dL — ABNORMAL LOW (ref 0.4–1.00)
Lab: 130 mg/dL (ref 7–25)
Lab: 39 mg/dL — ABNORMAL HIGH (ref 70–100)

## 2017-08-06 ENCOUNTER — Encounter: Admit: 2017-08-06 | Discharge: 2017-08-06 | Payer: MEDICARE

## 2017-08-06 DIAGNOSIS — N400 Enlarged prostate without lower urinary tract symptoms: ICD-10-CM

## 2017-08-06 DIAGNOSIS — Z972 Presence of dental prosthetic device (complete) (partial): ICD-10-CM

## 2017-08-06 DIAGNOSIS — I252 Old myocardial infarction: ICD-10-CM

## 2017-08-06 DIAGNOSIS — I5020 Unspecified systolic (congestive) heart failure: ICD-10-CM

## 2017-08-06 DIAGNOSIS — E785 Hyperlipidemia, unspecified: ICD-10-CM

## 2017-08-06 DIAGNOSIS — I10 Essential (primary) hypertension: ICD-10-CM

## 2017-08-06 DIAGNOSIS — Z95810 Presence of automatic (implantable) cardiac defibrillator: ICD-10-CM

## 2017-08-06 DIAGNOSIS — J449 Chronic obstructive pulmonary disease, unspecified: ICD-10-CM

## 2017-08-06 DIAGNOSIS — H409 Unspecified glaucoma: ICD-10-CM

## 2017-08-06 DIAGNOSIS — I2510 Atherosclerotic heart disease of native coronary artery without angina pectoris: Principal | ICD-10-CM

## 2017-08-06 DIAGNOSIS — N179 Acute kidney failure, unspecified: Principal | ICD-10-CM

## 2017-08-06 DIAGNOSIS — I739 Peripheral vascular disease, unspecified: Secondary | ICD-10-CM

## 2017-08-06 DIAGNOSIS — I5022 Chronic systolic (congestive) heart failure: ICD-10-CM

## 2017-08-06 DIAGNOSIS — E119 Type 2 diabetes mellitus without complications: Secondary | ICD-10-CM

## 2017-08-06 DIAGNOSIS — I255 Ischemic cardiomyopathy: ICD-10-CM

## 2017-08-07 ENCOUNTER — Encounter: Admit: 2017-08-07 | Discharge: 2017-08-07 | Payer: MEDICARE

## 2017-08-14 LAB — CREATININE
Lab: 1.9 g/dL — ABNORMAL HIGH (ref 3.5–5.0)
Lab: 37 U/L — ABNORMAL LOW (ref 25–110)

## 2017-08-26 ENCOUNTER — Encounter: Admit: 2017-08-26 | Discharge: 2017-08-26 | Payer: MEDICARE

## 2017-08-28 ENCOUNTER — Encounter: Admit: 2017-08-28 | Discharge: 2017-08-28 | Payer: MEDICARE

## 2017-09-04 LAB — BASIC METABOLIC PANEL
Lab: 1.7 — ABNORMAL HIGH (ref 0.72–1.25)
Lab: 132 — ABNORMAL HIGH (ref 80–115)
Lab: 140
Lab: 15 — ABNORMAL HIGH (ref 0–14)
Lab: 18 — ABNORMAL HIGH (ref 0.72–1.25)
Lab: 24 — ABNORMAL HIGH (ref 8.4–25.7)
Lab: 4.3
Lab: 41
Lab: 9.4

## 2017-10-14 ENCOUNTER — Encounter: Admit: 2017-10-14 | Discharge: 2017-10-14 | Payer: MEDICARE

## 2017-10-20 ENCOUNTER — Encounter: Admit: 2017-10-20 | Discharge: 2017-10-20 | Payer: MEDICARE

## 2017-10-20 ENCOUNTER — Ambulatory Visit: Admit: 2017-10-20 | Discharge: 2017-10-21 | Payer: MEDICARE

## 2017-10-20 DIAGNOSIS — J449 Chronic obstructive pulmonary disease, unspecified: ICD-10-CM

## 2017-10-20 DIAGNOSIS — Z95810 Presence of automatic (implantable) cardiac defibrillator: ICD-10-CM

## 2017-10-20 DIAGNOSIS — E785 Hyperlipidemia, unspecified: ICD-10-CM

## 2017-10-20 DIAGNOSIS — I10 Essential (primary) hypertension: Principal | ICD-10-CM

## 2017-10-20 DIAGNOSIS — Z972 Presence of dental prosthetic device (complete) (partial): ICD-10-CM

## 2017-10-20 DIAGNOSIS — H409 Unspecified glaucoma: ICD-10-CM

## 2017-10-20 DIAGNOSIS — I5022 Chronic systolic (congestive) heart failure: ICD-10-CM

## 2017-10-20 DIAGNOSIS — E119 Type 2 diabetes mellitus without complications: ICD-10-CM

## 2017-10-20 DIAGNOSIS — I5020 Unspecified systolic (congestive) heart failure: ICD-10-CM

## 2017-10-20 DIAGNOSIS — E782 Mixed hyperlipidemia: ICD-10-CM

## 2017-10-20 DIAGNOSIS — E11649 Type 2 diabetes mellitus with hypoglycemia without coma: ICD-10-CM

## 2017-10-20 DIAGNOSIS — I25110 Atherosclerotic heart disease of native coronary artery with unstable angina pectoris: ICD-10-CM

## 2017-10-20 DIAGNOSIS — I739 Peripheral vascular disease, unspecified: ICD-10-CM

## 2017-10-20 DIAGNOSIS — I255 Ischemic cardiomyopathy: ICD-10-CM

## 2017-10-20 DIAGNOSIS — I252 Old myocardial infarction: ICD-10-CM

## 2017-10-20 DIAGNOSIS — I2510 Atherosclerotic heart disease of native coronary artery without angina pectoris: ICD-10-CM

## 2017-10-20 DIAGNOSIS — N400 Enlarged prostate without lower urinary tract symptoms: ICD-10-CM

## 2017-10-20 LAB — BASIC METABOLIC PANEL

## 2017-10-26 ENCOUNTER — Encounter: Admit: 2017-10-26 | Discharge: 2017-10-26 | Payer: MEDICARE

## 2017-10-26 DIAGNOSIS — I255 Ischemic cardiomyopathy: Principal | ICD-10-CM

## 2017-10-26 DIAGNOSIS — Z79899 Other long term (current) drug therapy: ICD-10-CM

## 2017-11-09 ENCOUNTER — Encounter: Admit: 2017-11-09 | Discharge: 2017-11-09 | Payer: MEDICARE

## 2018-01-08 ENCOUNTER — Encounter: Admit: 2018-01-08 | Discharge: 2018-01-08 | Payer: MEDICARE

## 2018-01-08 DIAGNOSIS — I255 Ischemic cardiomyopathy: Principal | ICD-10-CM

## 2018-01-08 DIAGNOSIS — Z79899 Other long term (current) drug therapy: ICD-10-CM

## 2018-01-08 LAB — BASIC METABOLIC PANEL
Lab: 1.7 — ABNORMAL HIGH (ref 0.72–1.25)
Lab: 117 — ABNORMAL HIGH (ref 80–115)
Lab: 137 — ABNORMAL LOW (ref 4.70–6.10)
Lab: 14
Lab: 20
Lab: 25
Lab: 41 — ABNORMAL LOW (ref >59)
Lab: 9.6

## 2018-01-08 LAB — BNP (B-TYPE NATRIURETIC PEPTI): Lab: 546 — ABNORMAL HIGH (ref 0–100)

## 2018-01-08 LAB — CBC: Lab: 7.1

## 2018-01-25 LAB — CBC: Lab: 9

## 2018-01-25 LAB — COMPREHENSIVE METABOLIC PANEL: Lab: 137 — ABNORMAL LOW (ref 13.0–17.0)

## 2018-01-25 LAB — MAGNESIUM: Lab: 2 — ABNORMAL LOW (ref 40.0–50.0)

## 2018-01-25 LAB — BNP (B-TYPE NATRIURETIC PEPTI)

## 2018-01-26 LAB — BASIC METABOLIC PANEL: Lab: 136 — ABNORMAL LOW (ref 4.60–6.20)

## 2018-03-23 ENCOUNTER — Encounter: Admit: 2018-03-23 | Discharge: 2018-03-23 | Payer: MEDICARE

## 2018-03-25 ENCOUNTER — Encounter: Admit: 2018-03-25 | Discharge: 2018-03-25 | Payer: MEDICARE

## 2018-04-01 LAB — COMPREHENSIVE METABOLIC PANEL
Lab: 1
Lab: 12
Lab: 3.1 — ABNORMAL LOW (ref 3.4–5.0)
Lab: 41
Lab: 47 — ABNORMAL LOW (ref >60)
Lab: 57 — ABNORMAL LOW (ref >60)
Lab: 7.3 — ABNORMAL LOW (ref >60)
Lab: 70
Lab: 8
Lab: 8.9 — ABNORMAL LOW (ref 9.0–10.7)

## 2018-04-01 LAB — TOTAL THYROXINE T4: Lab: 12

## 2018-04-07 ENCOUNTER — Encounter: Admit: 2018-04-07 | Discharge: 2018-04-07 | Payer: MEDICARE

## 2018-04-08 ENCOUNTER — Encounter: Admit: 2018-04-08 | Discharge: 2018-04-08 | Payer: MEDICARE

## 2018-04-08 ENCOUNTER — Ambulatory Visit: Admit: 2018-04-08 | Discharge: 2018-04-09 | Payer: MEDICARE

## 2018-04-08 DIAGNOSIS — I5020 Unspecified systolic (congestive) heart failure: ICD-10-CM

## 2018-04-08 DIAGNOSIS — I739 Peripheral vascular disease, unspecified: ICD-10-CM

## 2018-04-08 DIAGNOSIS — Z95810 Presence of automatic (implantable) cardiac defibrillator: Principal | ICD-10-CM

## 2018-04-08 DIAGNOSIS — I10 Essential (primary) hypertension: ICD-10-CM

## 2018-04-08 DIAGNOSIS — I2510 Atherosclerotic heart disease of native coronary artery without angina pectoris: Principal | ICD-10-CM

## 2018-04-08 DIAGNOSIS — I5022 Chronic systolic (congestive) heart failure: ICD-10-CM

## 2018-04-08 DIAGNOSIS — I252 Old myocardial infarction: ICD-10-CM

## 2018-04-08 DIAGNOSIS — H409 Unspecified glaucoma: ICD-10-CM

## 2018-04-08 DIAGNOSIS — I255 Ischemic cardiomyopathy: ICD-10-CM

## 2018-04-08 DIAGNOSIS — Z972 Presence of dental prosthetic device (complete) (partial): ICD-10-CM

## 2018-04-08 DIAGNOSIS — E785 Hyperlipidemia, unspecified: ICD-10-CM

## 2018-04-08 DIAGNOSIS — N400 Enlarged prostate without lower urinary tract symptoms: ICD-10-CM

## 2018-04-08 DIAGNOSIS — E119 Type 2 diabetes mellitus without complications: Secondary | ICD-10-CM

## 2018-04-08 DIAGNOSIS — R0600 Dyspnea, unspecified: ICD-10-CM

## 2018-04-08 DIAGNOSIS — J449 Chronic obstructive pulmonary disease, unspecified: ICD-10-CM

## 2018-04-08 LAB — BNP (B-TYPE NATRIURETIC PEPTI): Lab: 367 g/dL — ABNORMAL HIGH (ref 0–100)

## 2018-04-08 LAB — BASIC METABOLIC PANEL
Lab: 1.5 — ABNORMAL HIGH (ref 0.72–1.25)
Lab: 103
Lab: 139
Lab: 14
Lab: 16
Lab: 162 — ABNORMAL HIGH
Lab: 26
Lab: 4.4
Lab: 49
Lab: 9.4

## 2018-04-08 MED ORDER — METOLAZONE 2.5 MG PO TAB
ORAL_TABLET | Freq: Every day | ORAL | 1 refills | 84.00000 days | Status: AC
Start: 2018-04-08 — End: 2018-05-27

## 2018-04-13 ENCOUNTER — Encounter: Admit: 2018-04-13 | Discharge: 2018-04-13 | Payer: MEDICARE

## 2018-05-27 ENCOUNTER — Encounter: Admit: 2018-05-27 | Discharge: 2018-05-27 | Payer: MEDICARE

## 2018-05-27 ENCOUNTER — Ambulatory Visit: Admit: 2018-05-27 | Discharge: 2018-05-27 | Payer: MEDICARE

## 2018-05-27 DIAGNOSIS — Z972 Presence of dental prosthetic device (complete) (partial): Secondary | ICD-10-CM

## 2018-05-27 DIAGNOSIS — I252 Old myocardial infarction: Secondary | ICD-10-CM

## 2018-05-27 DIAGNOSIS — N400 Enlarged prostate without lower urinary tract symptoms: Secondary | ICD-10-CM

## 2018-05-27 DIAGNOSIS — I10 Essential (primary) hypertension: Secondary | ICD-10-CM

## 2018-05-27 DIAGNOSIS — I255 Ischemic cardiomyopathy: Secondary | ICD-10-CM

## 2018-05-27 DIAGNOSIS — I739 Peripheral vascular disease, unspecified: Secondary | ICD-10-CM

## 2018-05-27 DIAGNOSIS — E119 Type 2 diabetes mellitus without complications: Secondary | ICD-10-CM

## 2018-05-27 DIAGNOSIS — I5020 Unspecified systolic (congestive) heart failure: Secondary | ICD-10-CM

## 2018-05-27 DIAGNOSIS — I5022 Chronic systolic (congestive) heart failure: Secondary | ICD-10-CM

## 2018-05-27 DIAGNOSIS — I2510 Atherosclerotic heart disease of native coronary artery without angina pectoris: Principal | ICD-10-CM

## 2018-05-27 DIAGNOSIS — H409 Unspecified glaucoma: Secondary | ICD-10-CM

## 2018-05-27 DIAGNOSIS — J449 Chronic obstructive pulmonary disease, unspecified: Secondary | ICD-10-CM

## 2018-05-27 DIAGNOSIS — E785 Hyperlipidemia, unspecified: Secondary | ICD-10-CM

## 2018-05-27 DIAGNOSIS — Z95810 Presence of automatic (implantable) cardiac defibrillator: Secondary | ICD-10-CM

## 2018-05-27 MED ORDER — SPIRONOLACTONE 25 MG PO TAB
12.5 mg | ORAL_TABLET | Freq: Every evening | ORAL | 3 refills | 90.00000 days | Status: AC
Start: 2018-05-27 — End: 2018-12-02

## 2018-05-27 MED ORDER — FUROSEMIDE 40 MG PO TAB
40 mg | ORAL_TABLET | ORAL | 3 refills | 90.00000 days | Status: AC | PRN
Start: 2018-05-27 — End: 2018-12-02

## 2018-06-02 ENCOUNTER — Encounter: Admit: 2018-06-02 | Discharge: 2018-06-02 | Payer: MEDICARE

## 2018-06-02 ENCOUNTER — Ambulatory Visit: Admit: 2018-06-02 | Discharge: 2018-06-03 | Payer: MEDICARE

## 2018-06-02 DIAGNOSIS — I509 Heart failure, unspecified: Secondary | ICD-10-CM

## 2018-06-15 ENCOUNTER — Encounter: Admit: 2018-06-15 | Discharge: 2018-06-15 | Payer: MEDICARE

## 2018-06-15 ENCOUNTER — Ambulatory Visit: Admit: 2018-06-15 | Discharge: 2018-06-16 | Payer: MEDICARE

## 2018-06-15 DIAGNOSIS — Z95810 Presence of automatic (implantable) cardiac defibrillator: Principal | ICD-10-CM

## 2018-06-15 NOTE — Telephone Encounter
Spoke with pt and he will call his cardiologist at [facility] to have the remote service transferred to [facility] Cardiology.

## 2018-08-02 LAB — COMPREHENSIVE METABOLIC PANEL
Lab: 1
Lab: 1.7 — ABNORMAL HIGH (ref 0.50–1.30)
Lab: 128 — ABNORMAL LOW (ref 136–145)
Lab: 178 — ABNORMAL HIGH (ref 10–49)
Lab: 24
Lab: 29 — ABNORMAL HIGH (ref 9–23)
Lab: 3.5
Lab: 46 — ABNORMAL LOW (ref >60)
Lab: 6.8
Lab: 8.7
Lab: 80 — ABNORMAL LOW (ref 150–400)
Lab: 86
Lab: 95 — ABNORMAL LOW (ref 98–107)

## 2018-08-02 LAB — THYROID STIMULATING HORMONE-TSH: Lab: 3.7

## 2018-08-02 LAB — CBC: Lab: 9.2

## 2018-08-02 LAB — MAGNESIUM: Lab: 1.7

## 2018-08-05 ENCOUNTER — Encounter: Admit: 2018-08-05 | Discharge: 2018-08-05 | Payer: MEDICARE

## 2018-08-24 ENCOUNTER — Encounter: Admit: 2018-08-24 | Discharge: 2018-08-24 | Payer: MEDICARE

## 2018-08-24 LAB — FREE T4 (FREE THYROXINE) ONLY: Lab: 0.9

## 2018-08-24 LAB — BASIC METABOLIC PANEL
Lab: 1.4 — ABNORMAL HIGH (ref 0.72–1.25)
Lab: 134 — ABNORMAL HIGH (ref 70–105)
Lab: 139 — ABNORMAL LOW (ref 4.70–6.10)
Lab: 14
Lab: 15 — ABNORMAL HIGH (ref 0–14)
Lab: 53 — ABNORMAL LOW (ref >59)
Lab: 9.7

## 2018-08-24 LAB — CBC: Lab: 6.5

## 2018-08-24 LAB — MAGNESIUM: Lab: 1.6 — ABNORMAL HIGH (ref 3.5–5.1)

## 2018-08-24 LAB — THYROID STIMULATING HORMONE-TSH: Lab: 6.7 — ABNORMAL HIGH (ref 0.35–4.94)

## 2018-08-24 NOTE — Progress Notes
Records Request    Medical records request for continuation of care:    Patient has appointment on 09/02/2018   with  Dr. Danella Maiers .    Please fax records to [facility]  [phone]    Request records: STAT      Most recent hospitalization/ Emergency Room Visit    Cardiac Catheterization    Device ICD Check- Most recent    EKG's           Recent Cardiac Testing    Any cardiac-related records    Recent Labs    Procedures    H&P/Discharge Summary          Thank you,      [facility]  [facility]  [address]  [place], [place] [address]  Phone:  [phone]  Fax:  [phone]

## 2018-08-25 MED ORDER — AMIODARONE 200 MG PO TAB
ORAL_TABLET | Freq: Two times a day (BID) | ORAL | 1 refills | 42.00000 days | Status: DC
Start: 2018-08-25 — End: 2018-12-02

## 2018-08-26 ENCOUNTER — Encounter: Admit: 2018-08-26 | Discharge: 2018-08-26 | Payer: MEDICARE

## 2018-08-30 ENCOUNTER — Encounter: Admit: 2018-08-30 | Discharge: 2018-08-30 | Payer: MEDICARE

## 2018-08-30 DIAGNOSIS — I10 Essential (primary) hypertension: ICD-10-CM

## 2018-08-30 DIAGNOSIS — I2510 Atherosclerotic heart disease of native coronary artery without angina pectoris: ICD-10-CM

## 2018-08-30 DIAGNOSIS — I5022 Chronic systolic (congestive) heart failure: Principal | ICD-10-CM

## 2018-08-30 DIAGNOSIS — I255 Ischemic cardiomyopathy: ICD-10-CM

## 2018-08-31 ENCOUNTER — Encounter: Admit: 2018-08-31 | Discharge: 2018-08-31 | Payer: MEDICARE

## 2018-09-01 ENCOUNTER — Encounter: Admit: 2018-09-01 | Discharge: 2018-09-01 | Payer: MEDICARE

## 2018-09-01 ENCOUNTER — Ambulatory Visit: Admit: 2018-09-01 | Discharge: 2018-09-01 | Payer: MEDICARE

## 2018-09-01 DIAGNOSIS — Z95810 Presence of automatic (implantable) cardiac defibrillator: Secondary | ICD-10-CM

## 2018-09-01 DIAGNOSIS — I5022 Chronic systolic (congestive) heart failure: Principal | ICD-10-CM

## 2018-09-02 ENCOUNTER — Encounter: Admit: 2018-09-02 | Discharge: 2018-09-02 | Payer: MEDICARE

## 2018-09-02 ENCOUNTER — Ambulatory Visit: Admit: 2018-09-02 | Discharge: 2018-09-03 | Payer: MEDICARE

## 2018-09-02 DIAGNOSIS — I2510 Atherosclerotic heart disease of native coronary artery without angina pectoris: ICD-10-CM

## 2018-09-02 DIAGNOSIS — I252 Old myocardial infarction: ICD-10-CM

## 2018-09-02 DIAGNOSIS — E119 Type 2 diabetes mellitus without complications: ICD-10-CM

## 2018-09-02 DIAGNOSIS — I10 Essential (primary) hypertension: ICD-10-CM

## 2018-09-02 DIAGNOSIS — Z95810 Presence of automatic (implantable) cardiac defibrillator: ICD-10-CM

## 2018-09-02 DIAGNOSIS — I739 Peripheral vascular disease, unspecified: ICD-10-CM

## 2018-09-02 DIAGNOSIS — Z972 Presence of dental prosthetic device (complete) (partial): ICD-10-CM

## 2018-09-02 DIAGNOSIS — H409 Unspecified glaucoma: ICD-10-CM

## 2018-09-02 DIAGNOSIS — J449 Chronic obstructive pulmonary disease, unspecified: ICD-10-CM

## 2018-09-02 DIAGNOSIS — I5020 Unspecified systolic (congestive) heart failure: ICD-10-CM

## 2018-09-02 DIAGNOSIS — I5022 Chronic systolic (congestive) heart failure: Principal | ICD-10-CM

## 2018-09-02 DIAGNOSIS — E785 Hyperlipidemia, unspecified: ICD-10-CM

## 2018-09-02 DIAGNOSIS — I255 Ischemic cardiomyopathy: ICD-10-CM

## 2018-09-02 DIAGNOSIS — N400 Enlarged prostate without lower urinary tract symptoms: ICD-10-CM

## 2018-09-02 MED ORDER — TELMISARTAN 80 MG PO TAB
40 mg | Freq: Every day | ORAL | 0 refills | 90.00000 days | Status: DC
Start: 2018-09-02 — End: 2018-12-02

## 2018-09-02 NOTE — Progress Notes
HENT: Positive for stridor and tinnitus.    Eyes: Negative.    Cardiovascular: Positive for dyspnea on exertion and irregular heartbeat.   Respiratory: Positive for cough, shortness of breath, sputum production and wheezing.    Endocrine: Negative.    Hematologic/Lymphatic: Bruises/bleeds easily.   Skin: Negative.    Musculoskeletal: Positive for arthritis, back pain, falls, joint pain, muscle weakness and neck pain.   Gastrointestinal: Negative.    Genitourinary: Negative.    Neurological: Positive for excessive daytime sleepiness, headaches, loss of balance and weakness.   Psychiatric/Behavioral: The patient has insomnia.    Allergic/Immunologic: Negative.        Physical Exam    Exam deferred    Problems Addressed Today  Encounter Diagnoses   Name Primary?   ??? Chronic systolic heart failure (HCC)    ??? Essential hypertension    ??? Coronary artery disease involving native coronary artery of native heart without angina pectoris    ??? ICD (implantable cardioverter-defibrillator) in place        Assessment and Plan       Chronic systolic heart failure (HCC)  Both furosemide and metolazone have been discontinued and he was started on Spironolactone in January.  He had recent lab work showing a serum potassium level of 5.5 so we will recheck a basic metabolic profile in another couple of weeks.  He has continued to lose weight and denies any problems with peripheral edema or other manifestations of fluid retention.  He has baseline exertional breathlessness that does not seem to be any worse at present.    Essential hypertension  He average of his home blood pressure readings seems to be right at about 130/80 and I thought we should probably increase his Micardis dosage slightly.    CAD (coronary artery disease), native coronary artery  He denies any angina or other symptoms that would suggest progression of coronary disease.    ICD (implantable cardioverter-defibrillator) in place

## 2018-09-03 ENCOUNTER — Encounter: Admit: 2018-09-03 | Discharge: 2018-09-03 | Payer: MEDICARE

## 2018-09-13 LAB — BASIC METABOLIC PANEL
Lab: 1.4 % — ABNORMAL HIGH (ref 0.72–1.25)
Lab: 107 % (ref 24–44)
Lab: 12 % (ref >60)
Lab: 139 FL (ref 7–11)
Lab: 23 % (ref 4–12)
Lab: 4.8 % (ref 41–77)
Lab: 9.1 K/UL (ref 1.0–4.8)

## 2018-09-14 ENCOUNTER — Encounter: Admit: 2018-09-14 | Discharge: 2018-09-14 | Payer: MEDICARE

## 2018-09-14 DIAGNOSIS — I10 Essential (primary) hypertension: ICD-10-CM

## 2018-09-14 DIAGNOSIS — I5022 Chronic systolic (congestive) heart failure: Principal | ICD-10-CM

## 2018-09-14 DIAGNOSIS — Z95810 Presence of automatic (implantable) cardiac defibrillator: ICD-10-CM

## 2018-09-14 DIAGNOSIS — I2510 Atherosclerotic heart disease of native coronary artery without angina pectoris: ICD-10-CM

## 2018-09-21 ENCOUNTER — Encounter: Admit: 2018-09-21 | Discharge: 2018-09-21 | Payer: MEDICARE

## 2018-09-21 NOTE — Telephone Encounter
Pt calling to cancel appt, he is currently admitted to [facility] for CHF.  I asked that he call us when he is discharged so we can follow-up.

## 2018-10-03 ENCOUNTER — Encounter: Admit: 2018-10-03 | Discharge: 2018-10-03 | Payer: MEDICARE

## 2018-10-03 DIAGNOSIS — Z95810 Presence of automatic (implantable) cardiac defibrillator: ICD-10-CM

## 2018-10-03 DIAGNOSIS — I739 Peripheral vascular disease, unspecified: ICD-10-CM

## 2018-10-03 DIAGNOSIS — J449 Chronic obstructive pulmonary disease, unspecified: ICD-10-CM

## 2018-10-03 DIAGNOSIS — I2510 Atherosclerotic heart disease of native coronary artery without angina pectoris: Principal | ICD-10-CM

## 2018-10-03 DIAGNOSIS — I252 Old myocardial infarction: ICD-10-CM

## 2018-10-03 DIAGNOSIS — I255 Ischemic cardiomyopathy: ICD-10-CM

## 2018-10-03 DIAGNOSIS — E785 Hyperlipidemia, unspecified: ICD-10-CM

## 2018-10-03 DIAGNOSIS — N400 Enlarged prostate without lower urinary tract symptoms: ICD-10-CM

## 2018-10-03 DIAGNOSIS — H409 Unspecified glaucoma: ICD-10-CM

## 2018-10-03 DIAGNOSIS — I5022 Chronic systolic (congestive) heart failure: ICD-10-CM

## 2018-10-03 DIAGNOSIS — E119 Type 2 diabetes mellitus without complications: ICD-10-CM

## 2018-10-03 DIAGNOSIS — I5020 Unspecified systolic (congestive) heart failure: ICD-10-CM

## 2018-10-03 DIAGNOSIS — I10 Essential (primary) hypertension: ICD-10-CM

## 2018-10-03 DIAGNOSIS — Z972 Presence of dental prosthetic device (complete) (partial): ICD-10-CM

## 2018-12-02 ENCOUNTER — Encounter: Admit: 2018-12-02 | Discharge: 2018-12-02

## 2018-12-02 ENCOUNTER — Ambulatory Visit: Admit: 2018-12-02 | Discharge: 2018-12-02

## 2018-12-02 ENCOUNTER — Ambulatory Visit: Admit: 2018-12-02 | Discharge: 2018-12-03

## 2018-12-02 DIAGNOSIS — I2510 Atherosclerotic heart disease of native coronary artery without angina pectoris: Secondary | ICD-10-CM

## 2018-12-02 DIAGNOSIS — I5022 Chronic systolic (congestive) heart failure: Secondary | ICD-10-CM

## 2018-12-02 DIAGNOSIS — Z95810 Presence of automatic (implantable) cardiac defibrillator: Secondary | ICD-10-CM

## 2018-12-02 DIAGNOSIS — I739 Peripheral vascular disease, unspecified: Secondary | ICD-10-CM

## 2018-12-02 DIAGNOSIS — E785 Hyperlipidemia, unspecified: Secondary | ICD-10-CM

## 2018-12-02 DIAGNOSIS — Z972 Presence of dental prosthetic device (complete) (partial): Secondary | ICD-10-CM

## 2018-12-02 DIAGNOSIS — I255 Ischemic cardiomyopathy: Secondary | ICD-10-CM

## 2018-12-02 DIAGNOSIS — I10 Essential (primary) hypertension: Secondary | ICD-10-CM

## 2018-12-02 DIAGNOSIS — E119 Type 2 diabetes mellitus without complications: Secondary | ICD-10-CM

## 2018-12-02 DIAGNOSIS — J449 Chronic obstructive pulmonary disease, unspecified: Secondary | ICD-10-CM

## 2018-12-02 DIAGNOSIS — I5020 Unspecified systolic (congestive) heart failure: Secondary | ICD-10-CM

## 2018-12-02 DIAGNOSIS — E782 Mixed hyperlipidemia: Secondary | ICD-10-CM

## 2018-12-02 DIAGNOSIS — H409 Unspecified glaucoma: Secondary | ICD-10-CM

## 2018-12-02 DIAGNOSIS — N400 Enlarged prostate without lower urinary tract symptoms: Secondary | ICD-10-CM

## 2018-12-02 DIAGNOSIS — I252 Old myocardial infarction: Secondary | ICD-10-CM

## 2018-12-02 LAB — TSH WITH FREE T4 REFLEX: Lab: 0 — ABNORMAL LOW (ref 0.35–4.94)

## 2018-12-02 LAB — BASIC METABOLIC PANEL: Lab: 133 — ABNORMAL LOW (ref 136–145)

## 2018-12-02 MED ORDER — AMIODARONE 200 MG PO TAB
200 mg | ORAL_TABLET | Freq: Every day | ORAL | 1 refills | 42.00000 days | Status: AC
Start: 2018-12-02 — End: ?

## 2018-12-02 MED ORDER — CARVEDILOL 3.125 MG PO TAB
3.125 mg | ORAL_TABLET | Freq: Two times a day (BID) | ORAL | 0 refills | 90.00000 days | Status: AC
Start: 2018-12-02 — End: ?

## 2018-12-02 MED ORDER — REPATHA SURECLICK 140 MG/ML SC PNIJ
140 mg | SUBCUTANEOUS | 11 refills | 28.00000 days | Status: DC
Start: 2018-12-02 — End: 2018-12-02

## 2018-12-02 MED ORDER — ALIROCUMAB 75 MG/ML SC PNIJ
75 mg | SUBCUTANEOUS | 11 refills | 28.00000 days | Status: AC
Start: 2018-12-02 — End: ?
  Filled 2018-12-09: qty 2, 28d supply, fill #1

## 2018-12-02 NOTE — Assessment & Plan Note
The most recent revascularization procedure was in December, 2018.  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  He is done well since then and has had no evidence of acute coronary syndromes or angina.

## 2018-12-02 NOTE — Progress Notes
Date of Service: 12/02/2018    Anterio William Hurley is a 68 y.o. male.       HPI     William Hurley was in the [place] clinic today with his daughter.  He is frustrated because he has not been able to get out and do things like he used to do a year ago.  He worries a lot about getting short of breath and he is reluctant to take additional diuretic because he has been told that it will be hard on his kidneys.    He currently appears to be at his dry weight and his breathing is fairly good today.  He does seem to have a very tight margin between being a little bit dry and going into pulmonary edema.  We talked today about the possibility of a CardioMEMS device and I suggested that he discuss this further with Dr. Pierre Bali at an office visit here in [place].    There is a lot of confusion about what his medication schedule is at home and his daughter is going to go through his pills after this appointment today to be sure we have an accurate listing of his current medications.    We talked about the fact that his LDL is still not treated to goal and he has had a lot of difficulty with myalgias symptoms on at least 2 different statin medications.  I raised the possibility of using Repatha and he is willing to consider this if it is affordable.         Vitals:    12/02/18 1026 12/02/18 1043   BP: 122/70 120/70   BP Source: Arm, Left Upper Arm, Right Upper   Pulse: 46    SpO2: 97%    Weight: 80.3 kg (177 lb)    Height: 1.676 m (5' 6)    PainSc: Zero      Body mass index is 28.57 kg/m???.     Past Medical History  Patient Active Problem List    Diagnosis Date Noted   ??? AKI (acute kidney injury) (HCC) 08/04/2017   ??? Fever 06/04/2017   ??? 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 History of tobacco use 05/14/2017     2007 - Quit smoking     ??? Stenosis of left subclavian artery [facility]) 04/15/2017     04/14/17: Left subclavian stent ([facility]) by Dr. Elizabeth Palau     ??? CAD (coronary artery disease), native coronary artery 04/14/2017 1998 - CABG @ [facility].  Diagnosis established by abnormal stress test, ordered as part of a work-up for PAD and claudication.    2002 - Cath:  LIMA-LAD patent, SVG's occluded.    04/2017 - NSTEMI presenting as sustained VT.  Coronary arteriogram @ [facility]:  80% ostial L main, 100% mid-LAD, diffuse, severe intermediate dz, LCx 100%, RCA 100%.  LIMA-LAD graft patent.  SVG-RCA 100%.  SVG-intermediate 100%, SVG-OM 100%.  80% L subclavian stenosis compromising LIMA flow.    04/2017 - L subclavian stent Chales Abrahams @ ).    05/27/2017 [facility] visit for chest pain.  Troponin negative.  EKG unchanged.  Dr. Kennedy Bucker recommended increase in Imdur dosage.       ??? Essential hypertension 04/14/2017   ??? HLD (hyperlipidemia) 04/14/2017   ??? Type II diabetes mellitus (HCC) 04/14/2017     ~2008 - Diagnosis established.     ??? Ischemic cardiomyopathy 04/14/2017   ??? Chronic systolic heart failure (HCC) 04/14/2017     08/2016 - Echo EF  29%.  Mild LVH.    04/2017 - Echo EF 20%.  Anterolateral, inferolateral, and inferior akinesis.  LVID 6.2 cm.     ??? ICD (implantable cardioverter-defibrillator) in place 04/14/2017     05/2016 - Bi-V ICD (St. Jude) implant, [facility], Dr. Bennetta Laos Dhir.      04/2017 - Sustained VT as NSTEMI presentation at [facility].  Cardioversion via ICD in [facility] ED (amiodarone ineffective in converting)   Re-programming while hospitalized @ [place].       ??? PVD (peripheral vascular disease) (HCC) 04/14/2017     02/04/2007 - R SFA stent, Dr. Zeb Comfort, [facility]    01/14/2008 - R leg PTA/stent, Dr. Zeb Comfort, [facility]        Circa 2010 - Bilateral iliac or SFA stenting @ [facility]           Review of Systems   Constitution: Negative.   HENT: Negative.    Eyes: Negative.    Cardiovascular: Negative.    Respiratory: Negative.    Endocrine: Negative.    Hematologic/Lymphatic: Negative.    Skin: Negative.    Musculoskeletal: Positive for muscle cramps and muscle weakness.   Gastrointestinal: Negative.    Genitourinary: Negative. Neurological: Negative.    Psychiatric/Behavioral: Negative.    Allergic/Immunologic: Negative.        Physical Exam    Physical Exam   General Appearance: no distress   Skin: warm, no ulcers or xanthomas   Digits and Nails: no cyanosis or clubbing   Eyes: conjunctivae and lids normal, pupils are equal and round   Teeth/Gums/Palate: dentition unremarkable, no lesions   Lips & Oral Mucosa: no pallor or cyanosis   Neck Veins: normal JVP , neck veins are not distended   Thyroid: no nodules, masses, tenderness or enlargement   Chest Inspection: chest is normal in appearance   Respiratory Effort: breathing comfortably, no respiratory distress   Auscultation/Percussion: lungs clear to auscultation, no rales or rhonchi, no wheezing   PMI: PMI not enlarged or displaced   Cardiac Rhythm: regular rhythm and normal rate   Cardiac Auscultation: S1, S2 normal, no rub, no gallop   Murmurs: no murmur   Peripheral Circulation: normal peripheral circulation   Carotid Arteries: normal carotid upstroke bilaterally, no bruits   Radial Arteries: normal symmetric radial pulses   Abdominal Aorta: no abdominal aortic bruit   Pedal Pulses: normal symmetric pedal pulses   Lower Extremity Edema: no lower extremity edema   Abdominal Exam: soft, non-tender, no masses, bowel sounds normal   Liver & Spleen: no organomegaly   Gait & Station: walks without assistance   Muscle Strength: normal muscle tone   Orientation: oriented to time, place and person   Affect & Mood: appropriate and sustained affect   Language and Memory: patient responsive and seems to comprehend information   Neurologic Exam: neurological assessment grossly intact   Other: moves all extremities        Problems Addressed Today  Encounter Diagnoses   Name Primary?   ??? Mixed hyperlipidemia    ??? Coronary artery disease involving native coronary artery of native heart without angina pectoris    ??? Essential hypertension    ??? Chronic systolic heart failure (HCC) ??? ICD (implantable cardioverter-defibrillator) in place        Assessment and Plan       HLD (hyperlipidemia)  He's developed intolerable myalgia symptoms on both atorvastatin and simvastatin.  He's still not to his LDL goal so I'd like to  start the prior authorization process for Repatha.    CAD (coronary artery disease), native coronary artery  The most recent revascularization procedure was in December, 2018.  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  He is done well since then and has had no evidence of acute coronary syndromes or angina.    Essential hypertension  Blood pressure today looks good but at home it is occasionally too low.  I have asked his daughter to check on the medications that he is actually taking before recommending any changes in his medical program.  Additionally, I would like to have him check his blood pressure at home more regularly so we can get an idea of what he generally runs.    Chronic systolic heart failure (HCC)  I suggested a cardio mems device to assist in diuretic management.  He tends to run a pretty fine line between being prerenal and developing flash pulmonary edema.    ICD (implantable cardioverter-defibrillator) in place  Normal device function with no therapies.  He has developed amiodarone-induced hypothyroidism and I rechecked a TSH today.  I decreased his amiodarone dosage from 400 to 200 mg/day.      Current Medications (including today's revisions)  ??? albuterol-ipratropium (DUO-NEB) 0.5 mg-3 mg(2.5 mg base)/3 mL nebulizer solution Inhale 3 mL by mouth into the lungs four times daily.   ??? amiodarone (CORDARONE) 200 mg tablet Take one tablet by mouth daily. Take with food.   ??? aspirin EC 81 mg tablet Take 81 mg by mouth daily. Take with food.   ??? atorvastatin (LIPITOR) 40 mg tablet Take one tablet by mouth at bedtime daily. ??? carvediloL (COREG) 3.125 mg tablet Take one tablet by mouth twice daily. Take with food.   ??? clopiDOGrel (PLAVIX) 75 mg tablet Take 75 mg by mouth daily.   ??? evolocumab (REPATHA SURECLICK) 140 mg/mL injectable PEN Inject 1 mL under the skin every 14 days.   ??? fluticasone (FLONASE) 50 mcg/actuation nasal spray Apply 2 sprays to each nostril as directed as Needed. Shake bottle gently before using.    ??? fluticasone-umeclidin-vilanter (TRELEGY ELLIPTA) 100-62.5-25 mcg dsdv Inhale 1 puff by mouth into the lungs twice daily.   ??? furosemide (LASIX) 20 mg tablet Take 20 mg by mouth every morning.   ??? glimepiride (AMARYL) 1 mg tablet Take 1 mg by mouth twice daily.   ??? HYDROcodone/acetaminophen (NORCO) 5/325 mg tablet Take 1 tablet by mouth every 4 hours as needed for Pain   ??? levothyroxine (SYNTHROID) 50 mcg tablet Take 50 mcg by mouth daily 30 minutes before breakfast.   ??? nitroglycerin (NITROSTAT) 0.4 mg tablet Place one tablet under tongue every 5 minutes as needed.   ??? potassium chloride (MICRO-K) 10 mEq capsule Take 10 mEq by mouth daily. Take with a meal and a full glass of water.   ??? pramipexole (MIRAPEX) 0.125 mg tablet Take 0.125 mg by mouth three times daily.   ??? tamsulosin (FLOMAX) 0.4 mg capsule Take 0.4 mg by mouth daily. Do not crush, chew or open capsules. Take 30 minutes following the same meal each day.   ??? telmisartan (MICARDIS) 20 mg tablet Take 10 mg by mouth daily.   ??? traZODone (DESYREL) 50 mg tablet Take 50 mg by mouth at bedtime as needed for Sleep.

## 2018-12-02 NOTE — Assessment & Plan Note
I suggested a cardio mems device to assist in diuretic management.  He tends to run a pretty fine line between being prerenal and developing flash pulmonary edema.

## 2018-12-02 NOTE — Assessment & Plan Note
Blood pressure today looks good but at home it is occasionally too low.  I have asked his daughter to check on the medications that he is actually taking before recommending any changes in his medical program.  Additionally, I would like to have him check his blood pressure at home more regularly so we can get an idea of what he generally runs.

## 2018-12-02 NOTE — Progress Notes
A prior authorization for Repatha is pending for [employer].  The following additional information is required to complete the prior authorization:    Plan prefers Praluent, emailed Lillette Boxer requesting a new prescription if a switch is appropriate.     The specialty pharmacy team has contacted the provider's nurse to notify them of the above requirements.  The specialty team will submit the prior authorization once all of the information is complete.    [place] Patient Advocate  [phone]

## 2018-12-02 NOTE — Assessment & Plan Note
He's developed intolerable myalgia symptoms on both atorvastatin and simvastatin.  He's still not to his LDL goal so I'd like to start the prior authorization process for Repatha.

## 2018-12-02 NOTE — Assessment & Plan Note
Normal device function with no therapies.  He has developed amiodarone-induced hypothyroidism and I rechecked a TSH today.  I decreased his amiodarone Tirone dosage from 400 to 200 mg/day.

## 2018-12-02 NOTE — Progress Notes
Patient does not qualify for CardioMEMS implant at this time. Most recent NYHA Class is documented as Class II. Patient would qualify for CardioMEMS implant if he advanced to NYHA Class III. Would need new echo if he should advance to Class III.     CardioMEMS Primary Screening                                  Date: 12/02/2018  Patient Referred by: Dr. Ricard Dillon        Heart failure admission within last 12 months                  Date: May 2020 Yes        NYHA HF Class III (If no, please document NYHA Class) No (NYHA Class II)        Able to take dual antiplatelet or anticoagulants for 1 month post implant Yes   Additional Screening if all above are yes        Patient with active infection No        History of recurrent PE or DVT  (If yes, does patient have IVC filter?) No        Unable to tolerate RHC No        GFR < 25 mL/min and non-responsive to diuretics or on chronic renal dialysis No        Congenital heart disease or mechanical right heart valve No        Unrepaired severe valvular disease? No        CRT device implanted within last 3 months?          Date of Implant: January 2018 No        BMI > 35, axillary chest circumference > 165 cm                        * cm  No        Patient unwilling to transmit device date or concerns with patient compliance No          Please ask patient the following:        Echo in last 6 months?  Date: 06/02/2018    EF:25% No       Patient manages own medications?  (If no, who manages pt's medications?) Yes       Patient/caregiver able to make medication interventions via phone?  Yes     CardioMEMS Pre-Implant Patient Education                                   Education Completed by: *   Video: How the CardioMEMs HF System Works *   Emergency planning/management officer  *

## 2018-12-06 ENCOUNTER — Encounter: Admit: 2018-12-06 | Discharge: 2018-12-06

## 2018-12-07 ENCOUNTER — Encounter: Admit: 2018-12-07 | Discharge: 2018-12-07

## 2018-12-07 NOTE — Progress Notes
The Prior Authorization for Praluent was submitted for Conley Rolls via [facility].  Will continue to follow.    [place] Patient Advocate  [phone]

## 2018-12-08 ENCOUNTER — Encounter: Admit: 2018-12-08 | Discharge: 2018-12-08

## 2018-12-08 DIAGNOSIS — E782 Mixed hyperlipidemia: Secondary | ICD-10-CM

## 2018-12-09 ENCOUNTER — Encounter: Admit: 2018-12-09 | Discharge: 2018-12-09

## 2018-12-09 NOTE — Telephone Encounter
Medication Counseling     William Hurley was contacted via telephone to provide medication education on their new medication.    William Hurley was educated on the medication - alirocumab (Praluent) along with the medication class (PCSK9 inhibitors). The indication for treatment, dose, route, frequency, and duration were discussed with the patient. The patient was educated on timely administration of therapy and management of missed doses. Adherence with therapy was discussed with the patient. The patient's ability to be adherent with drug therapies was discussed and the patient was provided options for tools/resources that promote adherence to therapy.     The patient's ability to self-administer the medication was assessed.  The patient was educated on proper administration/injection technique for their therapy and verbalized acceptance and understanding. Appropriate safe-handling, storage, and disposal directions were reviewed with the patient.     Contraindications to therapy, safety precautions, and common side effects were discussed with the patient. They were instructed to seek medical attention immediately if they experience signs of an allergic reaction, including but not limited to: a rash; hives; itching; red, swollen, blistered, or peeling skin with or without fever.     Requirements of the REMS program were discussed with the patient as applicable.     A medication history and reconciliation was performed (including prescription medications, supplements, over the counter, and herbal products). The medication list was updated and the patient's current medication list is listed below.     Home Medications    Medication Sig   albuterol-ipratropium (DUO-NEB) 0.5 mg-3 mg(2.5 mg base)/3 mL nebulizer solution Inhale 3 mL by mouth into the lungs four times daily.   alirocumab (PRALUENT PEN) 75 mg/mL injectable PEN Inject 1 mL under the skin every 14 days. amiodarone (CORDARONE) 200 mg tablet Take one tablet by mouth daily. Take with food.   aspirin EC 81 mg tablet Take 81 mg by mouth daily. Take with food.   atorvastatin (LIPITOR) 40 mg tablet Take one tablet by mouth at bedtime daily.   carvediloL (COREG) 3.125 mg tablet Take one tablet by mouth twice daily. Take with food.   clopiDOGrel (PLAVIX) 75 mg tablet Take 75 mg by mouth daily.   fluticasone (FLONASE) 50 mcg/actuation nasal spray Apply 2 sprays to each nostril as directed as Needed. Shake bottle gently before using.    fluticasone-umeclidin-vilanter (TRELEGY ELLIPTA) 100-62.5-25 mcg dsdv Inhale 1 puff by mouth into the lungs twice daily.   furosemide (LASIX) 20 mg tablet Take 20 mg by mouth every morning.   glimepiride (AMARYL) 1 mg tablet Take 1 mg by mouth twice daily.   HYDROcodone/acetaminophen (NORCO) 5/325 mg tablet Take 1 tablet by mouth every 4 hours as needed for Pain   levothyroxine (SYNTHROID) 50 mcg tablet Take 50 mcg by mouth daily 30 minutes before breakfast.   nitroglycerin (NITROSTAT) 0.4 mg tablet Place one tablet under tongue every 5 minutes as needed.   potassium chloride (MICRO-K) 10 mEq capsule Take 10 mEq by mouth daily. Take with a meal and a full glass of water.   pramipexole (MIRAPEX) 0.125 mg tablet Take 0.125 mg by mouth three times daily.   tamsulosin (FLOMAX) 0.4 mg capsule Take 0.4 mg by mouth daily. Do not crush, chew or open capsules. Take 30 minutes following the same meal each day.   telmisartan (MICARDIS) 20 mg tablet Take 10 mg by mouth daily.   traZODone (DESYREL) 50 mg tablet Take 50 mg by mouth at bedtime as needed for Sleep.  Drug-drug and drug-food interactions with the new therapy were assessed and reviewed with the patient. The patient was instructed to speak with their health care provider before starting any new drug, including prescription or over the counter, natural products, or vitamins. Pregnancy potential was reviewed with the patient. Male: education not applicable.    Appropriate recommended vaccinations were reviewed and discussed with the patient.    The monitoring and follow-up plan was discussed with the patient. The patient was instructed to contact their health care provider if their symptoms or health problems do not get better or if they become worse. William Hurley was instructed to contact the specialty pharmacy at [phone] if they have any questions or concerns regarding their medication therapy.     Based on the patients' preference the medication(s) will be picked up or shipped from [facility].     William Hurley was given the opportunity to ask questions and did not have any questions at this time.    Carmelina Dane, PHARMD

## 2018-12-09 NOTE — Progress Notes
The Prior Authorization for Praluent was approved for William Hurley from 05/03/2018 to 05/05/2019.  The copay is $47.00.    Copay assistance of $2500 was obtained for the patient using grant from [facility] and now the copay is $0.00.  William Hurley has stated this copay is affordable.  The specialty pharmacy will pursue additional copay assistance as necessary.  The specialty pharmacy will reach out to the provider's nurse if the copay becomes unaffordable.    I have emailed the [facility] for education on [place] Patient Advocate  [phone]

## 2018-12-13 ENCOUNTER — Encounter: Admit: 2018-12-13 | Discharge: 2018-12-13

## 2018-12-14 ENCOUNTER — Encounter: Admit: 2018-12-14 | Discharge: 2018-12-14

## 2018-12-24 ENCOUNTER — Encounter: Admit: 2018-12-24 | Discharge: 2018-12-24

## 2018-12-29 ENCOUNTER — Encounter: Admit: 2018-12-29 | Discharge: 2018-12-29

## 2018-12-30 ENCOUNTER — Encounter: Admit: 2018-12-30 | Discharge: 2018-12-30

## 2018-12-30 MED FILL — ALIROCUMAB 75 MG/ML SC PNIJ: 75 mg/mL | SUBCUTANEOUS | 28 days supply | Qty: 2 | Fill #2 | Status: AC

## 2018-12-31 ENCOUNTER — Encounter: Admit: 2018-12-31 | Discharge: 2018-12-31

## 2018-12-31 MED ORDER — LEVOTHYROXINE 50 MCG PO TAB
25 ug | ORAL_TABLET | Freq: Every day | ORAL | 0 refills | 30.00000 days | Status: AC
Start: 2018-12-31 — End: ?

## 2019-01-06 ENCOUNTER — Encounter: Admit: 2019-01-06 | Discharge: 2019-01-06

## 2019-01-07 ENCOUNTER — Encounter: Admit: 2019-01-07 | Discharge: 2019-01-07

## 2019-01-07 DIAGNOSIS — I5022 Chronic systolic (congestive) heart failure: Secondary | ICD-10-CM

## 2019-01-07 DIAGNOSIS — Z79899 Other long term (current) drug therapy: Secondary | ICD-10-CM

## 2019-01-07 NOTE — Progress Notes
Labs and/or CXR for amiodarone surveillance due per Amiodarone protocol.  Lab requisitions/orders and letter explaining need for testing mailed to patient or given to patient in clinic.

## 2019-01-13 ENCOUNTER — Encounter: Admit: 2019-01-13 | Discharge: 2019-01-13

## 2019-01-13 ENCOUNTER — Ambulatory Visit: Admit: 2019-01-13 | Discharge: 2019-01-13

## 2019-01-13 DIAGNOSIS — R0602 Shortness of breath: Secondary | ICD-10-CM

## 2019-01-14 ENCOUNTER — Encounter: Admit: 2019-01-14 | Discharge: 2019-01-14

## 2019-01-14 ENCOUNTER — Encounter: Admit: 2019-01-15 | Discharge: 2019-01-16

## 2019-01-14 DIAGNOSIS — I2510 Atherosclerotic heart disease of native coronary artery without angina pectoris: Secondary | ICD-10-CM

## 2019-01-14 DIAGNOSIS — I509 Heart failure, unspecified: Secondary | ICD-10-CM

## 2019-01-14 DIAGNOSIS — R570 Cardiogenic shock: Secondary | ICD-10-CM

## 2019-01-14 DIAGNOSIS — Z95810 Presence of automatic (implantable) cardiac defibrillator: Secondary | ICD-10-CM

## 2019-01-14 MED ORDER — FUROSEMIDE 200 MG IN D5W 100 ML IV DRIP
40 mg/h | INTRAVENOUS | 0 refills | Status: DC
Start: 2019-01-14 — End: 2019-01-16
  Administered 2019-01-15 (×2): 40 mg/h via INTRAVENOUS
  Administered 2019-01-15 (×2): 20 mg/h via INTRAVENOUS

## 2019-01-14 MED ORDER — CHLORHEXIDINE GLUCONATE 0.12 % MM MWSH
15 mL | Freq: Two times a day (BID) | ORAL | 0 refills | Status: DC
Start: 2019-01-14 — End: 2019-01-15

## 2019-01-14 MED ORDER — VANCOMYCIN RANDOM DOSING
1 | INTRAVENOUS | 0 refills | Status: DC
Start: 2019-01-14 — End: 2019-01-15

## 2019-01-14 MED ORDER — CEFEPIME 2G/100ML NS IVPB (MB+)
2 g | Freq: Once | INTRAVENOUS | 0 refills | Status: DC
Start: 2019-01-14 — End: 2019-01-15

## 2019-01-14 MED ORDER — CHLOROTHIAZIDE SODIUM 500 MG IV SOLR
500 mg | Freq: Once | INTRAVENOUS | 0 refills | Status: CP
Start: 2019-01-14 — End: ?
  Administered 2019-01-15: 01:00:00 500 mg via INTRAVENOUS

## 2019-01-14 MED ORDER — VANCOMYCIN 1,250 MG IVPB
15 mg/kg | INTRAVENOUS | 0 refills | Status: DC
Start: 2019-01-14 — End: 2019-01-15

## 2019-01-14 MED ORDER — CEFEPIME 2G/100ML NS IVPB (MB+)(EXTENDED INFUSION)
2 g | INTRAVENOUS | 0 refills | Status: DC
Start: 2019-01-14 — End: 2019-01-15

## 2019-01-14 MED ORDER — VASOPRESSIN 20 UNITS IN 100ML IV INFUSION
2.4 [IU]/h | INTRAVENOUS | 0 refills | Status: DC
Start: 2019-01-14 — End: 2019-01-16
  Administered 2019-01-15 (×2): 2.4 [IU]/h via INTRAVENOUS

## 2019-01-14 MED ORDER — CLOPIDOGREL 75 MG PO TAB
75 mg | Freq: Every day | ORAL | 0 refills | Status: DC
Start: 2019-01-14 — End: 2019-01-16

## 2019-01-14 MED ORDER — HEPARIN (PORCINE) 1,000 UNIT/ML IJ SOLN
20-40 [IU]/kg | INTRAVENOUS | 0 refills | Status: DC
Start: 2019-01-14 — End: 2019-01-15

## 2019-01-14 MED ORDER — FLUTICASONE PROPIONATE 50 MCG/ACTUATION NA SPSN
2 | Freq: Two times a day (BID) | NASAL | 0 refills | Status: DC
Start: 2019-01-14 — End: 2019-01-16

## 2019-01-14 MED ORDER — AZITHROMYCIN 250 ML IVPB
500 mg | INTRAVENOUS | 0 refills | Status: DC
Start: 2019-01-14 — End: 2019-01-15

## 2019-01-14 MED ORDER — PANTOPRAZOLE 40 MG IV SOLR
40 mg | Freq: Every day | INTRAVENOUS | 0 refills | Status: DC
Start: 2019-01-14 — End: 2019-01-15

## 2019-01-14 MED ORDER — CEFEPIME 2G/100ML NS IVPB (MB+)
2 g | Freq: Two times a day (BID) | INTRAVENOUS | 0 refills | Status: DC
Start: 2019-01-14 — End: 2019-01-15

## 2019-01-14 MED ORDER — VANCOMYCIN 1,250 MG IVPB
1250 mg | Freq: Once | INTRAVENOUS | 0 refills | Status: CP
Start: 2019-01-14 — End: ?
  Administered 2019-01-15 (×2): 1250 mg via INTRAVENOUS

## 2019-01-14 MED ORDER — IPRATROPIUM-ALBUTEROL 0.5 MG-3 MG(2.5 MG BASE)/3 ML IN NEBU
3 mL | Freq: Four times a day (QID) | RESPIRATORY_TRACT | 0 refills | Status: DC
Start: 2019-01-14 — End: 2019-01-15

## 2019-01-14 MED ORDER — UMECLIDINIUM-VILANTEROL 62.5-25 MCG/ACTUATION IN DSDV
1 | Freq: Every day | RESPIRATORY_TRACT | 0 refills | Status: DC
Start: 2019-01-14 — End: 2019-01-16

## 2019-01-14 MED ORDER — ALBUTEROL SULFATE 2.5 MG/0.5 ML IN NEBU
2.5 mg | Freq: Four times a day (QID) | RESPIRATORY_TRACT | 0 refills | Status: DC
Start: 2019-01-14 — End: 2019-01-16
  Administered 2019-01-15 (×4): 2.5 mg via RESPIRATORY_TRACT

## 2019-01-14 MED ORDER — AMIODARONE 200 MG PO TAB
200 mg | Freq: Every day | ORAL | 0 refills | Status: CN
Start: 2019-01-14 — End: ?

## 2019-01-14 MED ORDER — VANCOMYCIN PHARMACY TO MANAGE
1 | 0 refills | Status: DC
Start: 2019-01-14 — End: 2019-01-15

## 2019-01-14 MED ORDER — FUROSEMIDE IVPB
200 mg | Freq: Once | INTRAVENOUS | 0 refills | Status: CP
Start: 2019-01-14 — End: ?
  Administered 2019-01-15 (×2): 200 mg via INTRAVENOUS

## 2019-01-14 MED ORDER — FLUTICASONE PROPIONATE 110 MCG/ACTUATION IN HFAA
1 | Freq: Every day | RESPIRATORY_TRACT | 0 refills | Status: DC
Start: 2019-01-14 — End: 2019-01-16
  Administered 2019-01-15: 14:00:00 1 via RESPIRATORY_TRACT

## 2019-01-14 MED ORDER — HEPARIN (PORCINE) 1,000 UNIT/ML IJ SOLN
70 [IU]/kg | Freq: Once | INTRAVENOUS | 0 refills | Status: CP
Start: 2019-01-14 — End: ?

## 2019-01-14 MED ORDER — CEFEPIME 2G/100ML NS IVPB (MB+)
2 g | INTRAVENOUS | 0 refills | Status: DC
Start: 2019-01-14 — End: 2019-01-15
  Administered 2019-01-15 (×2): 2 g via INTRAVENOUS

## 2019-01-14 MED ORDER — IPRATROPIUM BROMIDE 0.02 % IN SOLN
.5 mg | Freq: Four times a day (QID) | RESPIRATORY_TRACT | 0 refills | Status: DC
Start: 2019-01-14 — End: 2019-01-16
  Administered 2019-01-15 (×4): 0.5 mg via RESPIRATORY_TRACT

## 2019-01-14 MED ORDER — HEPARIN (PORCINE) IN 5 % DEX 20,000 UNIT/500 ML (40 UNIT/ML) IV SOLP
0-2000 [IU]/h | INTRAVENOUS | 0 refills | Status: DC
Start: 2019-01-14 — End: 2019-01-15
  Administered 2019-01-15: 06:00:00 1137 [IU]/h via INTRAVENOUS

## 2019-01-14 MED ORDER — LEVOTHYROXINE 25 MCG PO TAB
25 ug | Freq: Every day | ORAL | 0 refills | Status: DC
Start: 2019-01-14 — End: 2019-01-16

## 2019-01-14 MED ORDER — DOBUTAMINE IN D5W 1,000 MG/250 ML (4,000 MCG/ML) IV SOLP
1 ug/kg/min | INTRAVENOUS | 0 refills | Status: DC
Start: 2019-01-14 — End: 2019-01-16
  Administered 2019-01-15: 01:00:00 3 ug/kg/min via INTRAVENOUS

## 2019-01-14 MED ORDER — VANCOMYCIN 1,250 MG IVPB
15 mg/kg | Freq: Once | INTRAVENOUS | 0 refills | Status: DC
Start: 2019-01-14 — End: 2019-01-15

## 2019-01-14 MED ORDER — AMIODARONE IN DEXTROSE,ISO-OSM 360 MG/200 ML (1.8 MG/ML) IV SOLN
1 mg/min | INTRAVENOUS | 0 refills | Status: DC
Start: 2019-01-14 — End: 2019-01-16
  Administered 2019-01-15 (×4): 1 mg/min via INTRAVENOUS

## 2019-01-14 MED ORDER — ASPIRIN 81 MG PO TBEC
81 mg | Freq: Every day | ORAL | 0 refills | Status: DC
Start: 2019-01-14 — End: 2019-01-16

## 2019-01-14 NOTE — Progress Notes
Patient diverted to [facility] from another hospital due to [facility] being at capacity.    They have met their ability to care for this patient, all of his physicians are here.  The patient is in Cardiogenic Shock.    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  He has an ICD, he is having multiple ventricular arrhythmias.  He was on dobutamine overnight, he did not tolerate that, they tried milrinone this morning, he did not tolerate that due to ventricular ectopy, so that is now off.  He gets IV Lasix TID.  He has put out 100 ml of urine since 0700 today.    He has had an Ultrasound of his right upper quadrant which shows Hepatic Congestion.  Chest xray shows Heart Failure.  SBP is 85-100 HR 50-130 O2 4L  holding his own. They tried BIPAP over night, he did not tolerate it.  he is afebrile.  They are trying to get a rep out to get his pacer / ICD interrogated.   He had a COVID-19 test this am and it is NEGATIVE.  WBC 17  Na 130  K+ 5.2  Cr 2.84 (baseline 1.4)  AST 1460  ALT 920  TBili 2.7  Trop 0.44

## 2019-01-15 ENCOUNTER — Encounter: Admit: 2019-01-15 | Discharge: 2019-01-15

## 2019-01-15 LAB — BLOOD GASES, ARTERIAL
Lab: 152 mmHg — ABNORMAL HIGH (ref 80–100)
Lab: 16 MMOL/L — ABNORMAL LOW (ref 21–28)
Lab: 32 mmHg — ABNORMAL LOW (ref 35–45)
Lab: 98 % (ref 95–99)
Lab: 7.3 — ABNORMAL LOW (ref 7.35–7.45)
Lab: 140 mmHg — ABNORMAL HIGH (ref 80–100)
Lab: 18 MMOL/L — ABNORMAL LOW (ref 21–28)
Lab: 35 mmHg — AB (ref 35–45)
Lab: 6.9 MMOL/L (ref 0.4–1.00)
Lab: 7.3 MMOL/L — ABNORMAL LOW (ref 7.35–7.45)
Lab: 99 % (ref 95–99)
Lab: 100 mmHg (ref 80–100)
Lab: 18 MMOL/L — ABNORMAL LOW (ref 21–28)
Lab: 33 mmHg — ABNORMAL LOW (ref 35–45)
Lab: 6.8 MMOL/L
Lab: 97 % (ref 95–99)
Lab: 7.3 (ref 7.35–7.45)
Lab: 123 mmHg — ABNORMAL HIGH (ref 80–100)
Lab: 19 MMOL/L — ABNORMAL LOW (ref 21–28)
Lab: 35 mmHg (ref 35–45)
Lab: 6 MMOL/L
Lab: 7.3 — ABNORMAL LOW (ref 7.35–7.45)
Lab: 98 % (ref 95–99)

## 2019-01-15 LAB — LACTIC ACID(LACTATE)
Lab: 3.5 MMOL/L — ABNORMAL HIGH (ref 0.5–2.0)
Lab: 2.1 MMOL/L — ABNORMAL HIGH (ref 0.5–2.0)
Lab: 2.1 MMOL/L — ABNORMAL HIGH (ref 0.5–2.0)
Lab: 1.8 MMOL/L (ref 0.5–2.0)

## 2019-01-15 LAB — O2 SATURATION, MIXED VENOUS
Lab: 52 % — AB (ref 0–0.80)
Lab: 59 % (ref >60)

## 2019-01-15 LAB — URINALYSIS DIPSTICK REFLEX TO CULTURE
Lab: NEGATIVE
Lab: NEGATIVE
Lab: NEGATIVE

## 2019-01-15 LAB — COMPREHENSIVE METABOLIC PANEL
Lab: 110 U/L (ref 25–110)
Lab: 131 MMOL/L — ABNORMAL LOW (ref 137–147)
Lab: 16 MMOL/L — ABNORMAL LOW (ref 21–30)
Lab: 18 mL/min — ABNORMAL LOW (ref >60)
Lab: 2.9 g/dL — ABNORMAL LOW (ref 3.5–5.0)
Lab: 21 K/UL — ABNORMAL HIGH (ref 3–12)
Lab: 22 mL/min — ABNORMAL LOW (ref >60)
Lab: 3.3 mg/dL — ABNORMAL HIGH (ref 0.3–1.2)
Lab: 6.4 g/dL — ABNORMAL HIGH (ref 6.0–8.0)
Lab: 658 U/L — ABNORMAL HIGH (ref 7–56)
Lab: 8.7 mg/dL (ref 8.5–10.6)
Lab: 906 U/L — ABNORMAL HIGH (ref 7–40)
Lab: 94 MMOL/L — ABNORMAL LOW (ref 98–110)
Lab: 100 U/L (ref 25–110)
Lab: 131 MMOL/L — ABNORMAL LOW (ref 137–147)
Lab: 149 mg/dL — ABNORMAL HIGH (ref 70–100)
Lab: 16 MMOL/L — ABNORMAL LOW (ref 21–30)
Lab: 17 mL/min — ABNORMAL LOW (ref >60)
Lab: 2.6 g/dL — ABNORMAL LOW (ref 3.5–5.0)
Lab: 21 mL/min — ABNORMAL LOW (ref >60)
Lab: 3.5 mg/dL — ABNORMAL HIGH (ref 0.4–1.24)
Lab: 5.7 g/dL — ABNORMAL LOW (ref 6.0–8.0)
Lab: 577 U/L — ABNORMAL HIGH (ref 7–56)
Lab: 719 U/L — ABNORMAL HIGH (ref 7–40)
Lab: 77 mg/dL — ABNORMAL HIGH (ref 7–25)
Lab: 8.3 mg/dL — ABNORMAL LOW (ref 8.5–10.6)
Lab: 17 — ABNORMAL HIGH (ref 3–12)
Lab: 128 MMOL/L — ABNORMAL LOW (ref 137–147)
Lab: 17 mL/min — ABNORMAL LOW (ref >60)
Lab: 20 mL/min — ABNORMAL LOW (ref >60)

## 2019-01-15 LAB — BASIC METABOLIC PANEL CELLULAR THERAPEUTICS
Lab: 129 MMOL/L — ABNORMAL LOW (ref 137–147)
Lab: 161 mg/dL — ABNORMAL HIGH (ref 70–100)
Lab: 17 mL/min — ABNORMAL LOW (ref >60)
Lab: 21 mL/min — ABNORMAL LOW (ref >60)
Lab: 3.5 mg/dL — ABNORMAL HIGH (ref 0.4–1.24)
Lab: 79 mg/dL — ABNORMAL HIGH (ref 7–25)
Lab: 8.4 mg/dL — ABNORMAL LOW (ref 8.5–10.6)
Lab: 16 — ABNORMAL HIGH (ref 3–12)

## 2019-01-15 LAB — URINALYSIS MICROSCOPIC REFLEX TO CULTURE

## 2019-01-15 LAB — MAGNESIUM
Lab: 2.3 mg/dL — ABNORMAL HIGH (ref 1.6–2.6)
Lab: 2.1 mg/dL (ref 1.6–2.6)
Lab: 2.1 mg/dL — ABNORMAL HIGH (ref >60)

## 2019-01-15 LAB — IONIZED CALCIUM,BG: Lab: 1.1 MMOL/L — ABNORMAL LOW (ref 1.0–1.3)

## 2019-01-15 LAB — POC BLOOD GAS ARTERIAL
Lab: 12 MMOL/L
Lab: 15 MMOL/L — ABNORMAL LOW (ref 21–28)
Lab: 35 mmHg (ref 35–45)
Lab: 98 % (ref 95–99)
Lab: 7.2 — ABNORMAL LOW (ref 7.35–7.45)

## 2019-01-15 LAB — FREE T4-FREE THYROXINE: Lab: 4.7 ng/dL — ABNORMAL HIGH (ref <20.7)

## 2019-01-15 LAB — MAGNESIUM  CELLULAR THERAPEUTICS: Lab: 2.1 mg/dL (ref 1.6–2.6)

## 2019-01-15 LAB — PTT (APTT)
Lab: 52 s — ABNORMAL HIGH (ref 24.0–36.5)
Lab: >20 s — ABNORMAL HIGH (ref 24.0–36.5)
Lab: 61 s — ABNORMAL HIGH (ref 24.0–36.5)

## 2019-01-15 LAB — PHOSPHORUS  CELLULAR THERAPEUTICS: Lab: 6.8 mg/dL — ABNORMAL HIGH (ref 2.0–4.5)

## 2019-01-15 LAB — CBC AND DIFF
Lab: 13 10*3/uL — ABNORMAL HIGH (ref 4.5–11.0)
Lab: 12 10*3/uL — ABNORMAL HIGH (ref 4.5–11.0)

## 2019-01-15 LAB — PROTIME INR (PT): Lab: 2.5 g/dL — ABNORMAL HIGH (ref 0.8–1.2)

## 2019-01-15 LAB — O2HGB SAT-VENOUS POC
Lab: 45 % — ABNORMAL LOW (ref 55–71)
Lab: 50 % — ABNORMAL LOW (ref 55–71)

## 2019-01-15 LAB — TROPONIN-I
Lab: 0.1 ng/mL — ABNORMAL HIGH (ref 0.0–0.05)
Lab: 0.1 ng/mL — ABNORMAL HIGH (ref 0.0–0.05)

## 2019-01-15 LAB — TSH WITH FREE T4 REFLEX: Lab: 0 uU/mL — ABNORMAL LOW (ref 0.35–5.00)

## 2019-01-15 LAB — BNP (B-TYPE NATRIURETIC PEPTI): Lab: 168 pg/mL — ABNORMAL HIGH (ref 0–100)

## 2019-01-15 LAB — POC GLUCOSE
Lab: 178 mg/dL — ABNORMAL HIGH (ref 70–100)
Lab: 222 mg/dL — ABNORMAL HIGH (ref 70–100)

## 2019-01-15 LAB — LACTIC ACID (BG - RAPID LACTATE): Lab: 3.6 MMOL/L — ABNORMAL HIGH (ref 0.5–2.0)

## 2019-01-15 MED ORDER — SENNOSIDES-DOCUSATE SODIUM 8.6-50 MG PO TAB
1 | Freq: Two times a day (BID) | ORAL | 0 refills | Status: DC
Start: 2019-01-15 — End: 2019-01-16

## 2019-01-15 MED ORDER — LORAZEPAM 2 MG/ML IJ SOLN
.5 mg | INTRAVENOUS | 0 refills | Status: DC | PRN
Start: 2019-01-15 — End: 2019-01-16
  Administered 2019-01-15 – 2019-01-16 (×3): 0.5 mg via INTRAVENOUS

## 2019-01-15 MED ORDER — CRRT HEPARIN DIALYSATE SOLN 5000ML
INTRAVENOUS_CENTRAL | 0 refills | Status: DC
Start: 2019-01-15 — End: 2019-01-15

## 2019-01-15 MED ORDER — CEFEPIME 2G/100ML NS IVPB (MB+)
2 g | INTRAVENOUS | 0 refills | Status: DC
Start: 2019-01-15 — End: 2019-01-16

## 2019-01-15 MED ORDER — CEFEPIME 2G/100ML NS IVPB (MB+)
2 g | Freq: Two times a day (BID) | INTRAVENOUS | 0 refills | Status: DC
Start: 2019-01-15 — End: 2019-01-15
  Administered 2019-01-15 (×2): 2 g via INTRAVENOUS

## 2019-01-15 MED ORDER — SODIUM CHLORIDE 0.9 % IV SOLP
1000-2000 mL | 0 refills | Status: DC | PRN
Start: 2019-01-15 — End: 2019-01-15

## 2019-01-15 MED ORDER — CRRT HEPARIN REPLACEMENT SOLN-POST 5000ML
INTRAVENOUS_CENTRAL | 0 refills | Status: DC
Start: 2019-01-15 — End: 2019-01-15

## 2019-01-15 MED ORDER — LIDOCAINE IN 5 % DEXTROSE (PF) 8 MG/ML (0.8 %) IV SOLP
1 mg/min | INTRAVENOUS | 0 refills | Status: DC
Start: 2019-01-15 — End: 2019-01-16
  Administered 2019-01-15: 14:00:00 1 mg/min via INTRAVENOUS

## 2019-01-15 MED ORDER — HEPARIN (PORCINE) 20,000 UNIT/250 ML IV INFUSION
0-2000 [IU]/h | INTRAVENOUS | 0 refills | Status: DC
Start: 2019-01-15 — End: 2019-01-16
  Administered 2019-01-15 (×2): 1137 [IU]/h via INTRAVENOUS

## 2019-01-15 MED ORDER — LIDOCAINE HCL 20 MG/ML (2 %) IJ SOLN
100 mg | Freq: Once | INTRAMUSCULAR | 0 refills | Status: DC
Start: 2019-01-15 — End: 2019-01-15

## 2019-01-15 MED ORDER — CHLOROTHIAZIDE SODIUM 500 MG IV SOLR
500 mg | Freq: Once | INTRAVENOUS | 0 refills | Status: CP
Start: 2019-01-15 — End: ?
  Administered 2019-01-15: 13:00:00 500 mg via INTRAVENOUS

## 2019-01-15 MED ORDER — LIDOCAINE (PF) 100 MG/5 ML (2 %) IV SYRG
100 mg | Freq: Once | INTRAVENOUS | 0 refills | Status: CP
Start: 2019-01-15 — End: ?
  Administered 2019-01-15: 13:00:00 100 mg via INTRAVENOUS

## 2019-01-15 MED ORDER — POLYETHYLENE GLYCOL 3350 17 GRAM PO PWPK
1 | Freq: Every day | ORAL | 0 refills | Status: DC
Start: 2019-01-15 — End: 2019-01-16

## 2019-01-15 MED ORDER — AMIODARONE 150 MG IN D5W 100 ML IVPB LOAD
150 mg | Freq: Once | INTRAVENOUS | 0 refills | Status: CP
Start: 2019-01-15 — End: ?
  Administered 2019-01-15 (×2): 150 mg via INTRAVENOUS

## 2019-01-15 MED ORDER — FENTANYL CITRATE (PF) 50 MCG/ML IJ SOLN
12.5 ug | INTRAVENOUS | 0 refills | Status: DC | PRN
Start: 2019-01-15 — End: 2019-01-16
  Administered 2019-01-15: 18:00:00 12.5 ug via INTRAVENOUS

## 2019-01-15 MED ORDER — FENTANYL CITRATE (PF) 50 MCG/ML IJ SOLN
12.5 ug | Freq: Once | INTRAVENOUS | 0 refills | Status: CP
Start: 2019-01-15 — End: ?
  Administered 2019-01-15: 16:00:00 12.5 ug via INTRAVENOUS

## 2019-01-15 MED ORDER — AMIODARONE 150 MG IN D5W 100 ML IVPB LOAD
150 mg | Freq: Once | INTRAVENOUS | 0 refills | Status: CP
Start: 2019-01-15 — End: ?
  Administered 2019-01-16 (×2): 150 mg via INTRAVENOUS

## 2019-01-15 MED ORDER — CRRT HEPARIN REPLACEMENT SOLN-PRE 5000ML
INTRAVENOUS_CENTRAL | 0 refills | Status: DC
Start: 2019-01-15 — End: 2019-01-15

## 2019-01-15 MED ORDER — INSULIN ASPART 100 UNIT/ML SC FLEXPEN
0-6 [IU] | Freq: Before meals | SUBCUTANEOUS | 0 refills | Status: DC
Start: 2019-01-15 — End: 2019-01-16
  Administered 2019-01-15: 23:00:00 2 [IU] via SUBCUTANEOUS

## 2019-01-15 MED ORDER — SODIUM PHOSPHATE IVPB
30 MMOL | INTRAVENOUS | 0 refills | Status: DC | PRN
Start: 2019-01-15 — End: 2019-01-15

## 2019-01-15 MED ORDER — HEPARIN (PORCINE) BOLUS FOR CONTINUOUS INF (DBL CONC BAG)
20-40 [IU]/kg | INTRAVENOUS | 0 refills | Status: DC
Start: 2019-01-15 — End: 2019-01-16

## 2019-01-15 MED ORDER — LINEZOLID IN DEXTROSE 5% 600 MG/300 ML IV PGBK
600 mg | Freq: Two times a day (BID) | INTRAVENOUS | 0 refills | Status: DC
Start: 2019-01-15 — End: 2019-01-16
  Administered 2019-01-15 (×2): 600 mg via INTRAVENOUS

## 2019-01-15 MED ORDER — SODIUM PHOSPHATE IVPB
16 MMOL | INTRAVENOUS | 0 refills | Status: DC | PRN
Start: 2019-01-15 — End: 2019-01-15

## 2019-01-15 MED ORDER — SODIUM PHOSPHATE IVPB
24 MMOL | INTRAVENOUS | 0 refills | Status: DC | PRN
Start: 2019-01-15 — End: 2019-01-15

## 2019-01-15 MED ORDER — AMIODARONE 150 MG IN D5W 100 ML IVPB LOAD
150 mg | Freq: Once | INTRAVENOUS | 0 refills | Status: CP
Start: 2019-01-15 — End: ?
  Administered 2019-01-15 (×2): 150 mg via INTRAVENOUS

## 2019-01-15 MED ORDER — ARTIFICIAL SALIVA (YERBAS-LYT) MM SPRA
1 | OROMUCOSAL | 0 refills | Status: DC | PRN
Start: 2019-01-15 — End: 2019-01-16
  Administered 2019-01-15: 16:00:00 1 via OROMUCOSAL

## 2019-01-15 MED ORDER — LIDOCAINE (PF) 100 MG/5 ML (2 %) IV SYRG
50 mg | Freq: Once | INTRAVENOUS | 0 refills | Status: CP
Start: 2019-01-15 — End: ?
  Administered 2019-01-15: 19:00:00 50 mg via INTRAVENOUS

## 2019-01-15 MED ADMIN — WATER FOR INJECTION, STERILE IJ SOLN [79513]: 20 mL | INTRAVENOUS | @ 01:00:00 | Stop: 2019-01-15 | NDC 00409488723

## 2019-01-15 MED ADMIN — SODIUM CHLORIDE 0.9 % IV SOLP [27838]: 1000 mL | INTRAVENOUS | @ 04:00:00 | Stop: 2019-01-15 | NDC 00338004904

## 2019-01-15 NOTE — Care Plan
Problem: Discharge Planning  Goal: Participation in plan of care  01/15/2019 0236 by Jennell Corner, RN  Outcome: Goal Ongoing  01/15/2019 0236 by Jennell Corner, RN  Outcome: Goal Ongoing  Goal: Knowledge regarding plan of care  01/15/2019 0236 by Jennell Corner, RN  Outcome: Goal Ongoing  01/15/2019 0236 by Jennell Corner, RN  Outcome: Goal Ongoing  Goal: Prepared for discharge  01/15/2019 0236 by Jennell Corner, RN  Outcome: Goal Ongoing  01/15/2019 0236 by Jennell Corner, RN  Outcome: Goal Ongoing     Problem: Infection, Risk of, Urinary Catheter-Associated Urinary Tract Infection  Goal: Absence of urinary catheter-associated infection  01/15/2019 0236 by Jennell Corner, RN  Outcome: Goal Ongoing  01/15/2019 0236 by Jennell Corner, RN  Outcome: Goal Ongoing     Problem: Infection, Risk of, Central Venous Catheter-Associated Bloodstream Infection  Goal: Absence of CVC Associated Bloodstream infection  01/15/2019 0236 by Jennell Corner, RN  Outcome: Goal Ongoing  01/15/2019 0236 by Jennell Corner, RN  Outcome: Goal Ongoing     Problem: Skin Integrity  Goal: Skin integrity intact  01/15/2019 0236 by Jennell Corner, RN  Outcome: Goal Ongoing  01/15/2019 0236 by Jennell Corner, RN  Outcome: Goal Ongoing  Goal: Healing of skin (Wound & Incision)  01/15/2019 0236 by Jennell Corner, RN  Outcome: Goal Ongoing  01/15/2019 0236 by Jennell Corner, RN  Outcome: Goal Ongoing  Goal: Healing of skin (Pressure Injury)  01/15/2019 0236 by Jennell Corner, RN  Outcome: Goal Ongoing  01/15/2019 0236 by Jennell Corner, RN  Outcome: Goal Ongoing     Problem: Falls, High Risk of  Goal: Absence of falls-Adult Patient  01/15/2019 0236 by Jennell Corner, RN  Outcome: Goal Ongoing  01/15/2019 0236 by Jennell Corner, RN  Outcome: Goal Ongoing  Goal: Absence of Falls-Pediatric patient  01/15/2019 0236 by Jennell Corner, RN  Outcome: Goal Ongoing  01/15/2019 0236 by Jennell Corner, RN  Outcome: Goal Ongoing     Problem: Self-Care Deficit  Goal: Maximize Heart Failure Knowledge Outcome: Goal Ongoing  Goal: Maximize Heart Failure Attitudes  Outcome: Goal Ongoing  Goal: Maximize Heart Failure Behaviors  Outcome: Goal Ongoing

## 2019-01-15 NOTE — Progress Notes
William Hurley  Today's Date:  01/15/2019  Admission Date: 01/14/2019  LOS: 1 day      Principal Problem:    CHF (NYHA class IV, ACC/AHA stage D) (HCC)  Active Problems:    CAD (coronary artery disease), native coronary artery    ICD (implantable cardioverter-defibrillator) in place    Cardiogenic shock (HCC)      William Hurley is a 68 y.o. 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    On arrival, Cardiogenic shock conference was activated, send to cath lab for IABP placement and RHC.   RHC: RA 25, RV 43/22, PA 52/25 (34), PWCP 23, CO 3.45, CI 1.84 (fick)    INTERVAL EVENTS:   - IABP placed, continue 1:1   - Maintain PAC, trend hemodynamics, MvO2, Fick, Lactate   - Continue DBA @ 3, vasopressin weaned off    - Frequent PVCs, continue amiodarone @ 1    - NIPPV for increased WOB   - Lasix gtt for diuresis    - Pan cultured and begin broad coverage antibiotics       Assessment/Plan:        Neuro: Oriented x3, lethargic. MAE. Delirium prevention. May use melatonin PRN.     Cardiac: Cardiogenic shock, IABP 1:1, continue dobutamine @ 3, PAC for hemodynamics, Trend Lactate, Mvo2, Fick. Frequent PVCs, continue amiodarone @ 1. Heparin gtt while IABP. Poor UOP after 500 duiril + 200 lasix. Diuresis with lasix gtt @ 20mg /hr. Most recent echo 20-25%, mild MR, trace AI. Possible repeat LHC with improved kidney function. Hx subclavian stenosis s/t stent, doppler BUE today.     Respiratory: COPD, arrived on 4L NC. ABG 7.30/32/152. NIPPV overnight d/t increased WOB. Trend ABG. Nebs PRN.     GI: Failed swallow study at OSH, no known dysphagia history. Plan to place corpak for enteral access. NPO for now. Elevated liver enzymes, likely 2/2 shock.      Heme: Hgb 10.4, Plt 130. Continue heparin gtt.     ID: Lactic acidosis, leukocytosis. Possibly 2/2 to cardiogenic shock but will begin empiric coverage with linezolid + cefepime, blood cultures pending. UA with 10-20 WBC, reflex to culture pending.      Renal: AKI, Cr 3.45 on admission, low UOP after 500 IV diuril, 200 IV lasix. Aggressive diuresis with lasix gtt as above. Metabolic acidosis, CO2 16 on chemistry. Trending lactate, CMP.     Endocrine:  Glucose 117 on chemistry. SSI as indicated.     FEN:   - Magnesium goal >2.0, i-Cal goal > 1.0, Potassium goal >4.0 mEq/L    Prophylaxis Review:   A)GI: PPI/H2Blocker - n/a  B) Lines:  Yes; Arterial Line; Indication:  Frequent blood draws and Continuous BP monitoring; Location:  Radial  Central Line; Indication:  Med not deliverable peripherally and Hemodynamic monitoring; Type:  Internal jugular  C) Urinary Catheter:  Yes; Retain foley due to:  Patient on diuretics, Need for accurate Intake and Output, Decreased urine output and Acute renal insufficiency or failure  D) Antibiotic Usage:  Yes; Infection present or suspected:  empiric coverage  E) VTE:  Heparin gtt  F) Isolation: none  Disposition/Family: ICU    Primary service: Heart Failure      Objective:        Medications:  Scheduled Meds:albuterol 0.5% (PROVENTIL) nebulizer solution 2.5 mg, 2.5 mg, Inhalation, QID  And  ipratropium bromide (ATROVENT) 0.02 % nebulizer solution 0.5 mg, 0.5 mg, Inhalation, QID  aspirin EC tablet 81 mg, 81 mg, Oral, QDAY  cefepime (MAXIPIME) 2 g in sodium chloride 0.9% (NS) 100 mL IVPB (MB+), 2 g, Intravenous, Q24H*  clopiDOGrel (PLAVIX) tablet 75 mg, 75 mg, Oral, QDAY  fluticasone propionate (FLONASE) nasal spray 2 spray, 2 spray, Each Nostril, BID  fluticasone propionate (FLOVENT HFA) 110 mcg/actuation inhaler 1 puff, 1 puff, Inhalation, QDAY    And  umeclidinium-vilanteroL (ANORO ELLIPTA) 62.5-25 mcg/actuation inhaler 1 puff, 1 puff, Inhalation, QDAY  FUROSEMIDE 10 MG/ML IJ SOLN (Cabinet Override), , , NOW  heparin (porcine) injection 1,520-3,030 Units, 20-40 Units/kg, Intravenous, As Prescribed levothyroxine (SYNTHROID) tablet 25 mcg, 25 mcg, Oral, QDAY 30 min before breakfast  LIDOCAINE (PF) 10 MG/ML (1 %) IJ SOLN (Cabinet Override), , , NOW  linezolid  (ZYVOX)  600 mg/D5W 300 mL IVPB, 600 mg, Intravenous, Q12H*  SODIUM CHLORIDE 0.9 % IV SOLP (Cabinet Override), , , NOW  SODIUM CHLORIDE 0.9 % IV SOLP (Cabinet Override), , , NOW  SODIUM CHLORIDE 0.9 % IV SOLP (Cabinet Override), , , NOW  VASOPRESSIN 20 UNIT/ML IV SOLN (Cabinet Override), , , NOW    Continuous Infusions:  ??? amiodarone (CORDARONE) 360 mg in dextrose, iso-osm 200 mL infusion 1 mg/min (01/15/19 0000)   ??? DOBUTamine (DOBUTREX) 1 g/D5W 250 mL infusion (std conc)(premade) 3 mcg/kg/min (01/15/19 0000)   ??? furosemide (LASIX) 200 mg in dextrose 5% (D5W) 100 mL IV drip (std conc) 20 mg/hr (01/15/19 0058)   ??? heparin (porcine) 20,000 units/D5W 500 mL infusion (std conc)(premade) 1,137 Units/hr (01/15/19 0059)   ??? vasopressin (VASOSTRICT) 20 Units in sodium chloride 0.9% (NS) 100 mL IV drip (std conc) Stopped (01/14/19 2308)     PRN and Respiratory Meds:                       Vital Signs: Last Filed                  Vital Signs: 24 Hour Range   BP: 85/66 (09/11 2010)  Temp: 36.7 ???C (98 ???F) (09/12 0000)  Pulse: 82 (09/12 0100)  Respirations: 15 PER MINUTE (09/12 0100)  SpO2: (P) 100 % (09/12 0200)  SpO2 Pulse: 91 (09/12 0100)  Height: (P) 170.2 cm (5' 7.01) (09/11 2000) BP: (85-193)/(66-150)   ABP: (113-123)/(40-43)   ART MAP (Calculated) mm Hg:  [51 mm Hg-54 mm Hg]   Temp:  [36.7 ???C (98 ???F)-36.7 ???C (98.1 ???F)]   Pulse:  [71-87]   Respirations:  [15 PER MINUTE-26 PER MINUTE]   SpO2:  [94 %-100 %]      Vitals:    01/14/19 2000   Weight: (P) 75.8 kg (167 lb 1.7 oz)       Critical Care Vitals:  Vigileo  SVI (Calculated): (!) 25 (01/14/19 2258)  SVRI (Calculated): 2049 (01/14/19 2258)   ICP Monitoring:     PA  Catheter:  PA Catheter Only  PA Pressure: (!) 47/24 (01/15/19 0100)  PA Mean (mm Hg): (!) 31 mm Hg (01/15/19 0100)  CO: (!) 3.8 (01/14/19 2258) Cardiac Output Method: Indirect Fick Calculation (01/14/19 2258)  CI: (!) 2.03 (01/14/19 2258)  SVR (Calculated): 926 (01/14/19 2258)  SVRI (Calculated): 2049 (01/14/19 2258)  SV (Calculated): (!) 47 (01/14/19 2258)  SVI (Calculated): (!) 25 (01/14/19 2258)  Hemodynamics/Oxycalcs:  Hemodynamics/Oxycalcs  Device Type: Standard PA Catheter (01/14/19 2258)  PA Pressure: (!) 47/24 (01/15/19 0100)  PA Mean (mm Hg): (!) 31 mm Hg (01/15/19 0100)  CVP: (!) 20 MM HG (01/15/19 0100)  CO: (!) 3.8 (01/14/19 2258)  Cardiac Output Method: Indirect Fick Calculation (01/14/19 2258)  CI: (!) 2.03 (01/14/19 2258)  SVR (Calculated): 926 (01/14/19 2258)  SVRI (Calculated): 2049 (01/14/19 2258)  RVSWI (Calculated): (!) 2 (01/14/19 2258)  SV (Calculated): (!) 47 (01/14/19 2258)  SVI (Calculated): (!) 25 (01/14/19 2258)    Intake/Output Summary:  (Last 24 hours)    Intake/Output Summary (Last 24 hours) at 01/15/2019 0216  Last data filed at 01/15/2019 0100  Gross per 24 hour   Intake 553.49 ml   Output 275 ml   Net 278.49 ml       Wt Readings from Last 10 Encounters:   01/14/19 (P) 75.8 kg (167 lb 1.7 oz)   01/13/19 75.8 kg (167 lb)   12/02/18 80.3 kg (177 lb)   09/02/18 81.2 kg (179 lb)   06/02/18 85.3 kg (188 lb)   05/27/18 84.8 kg (187 lb)   04/08/18 85.3 kg (188 lb)   10/20/17 87.5 kg (193 lb)   08/04/17 90.6 kg (199 lb 12.8 oz)   07/09/17 91.7 kg (202 lb 3.2 oz)       Laboratory:  Point of Care Testing:  (Last 24 hours):  Glucose: (!) 117 (01/14/19 2002)    Lab Review:  Pertinent labs reviewed      Radiology and Other Diagnostic Procedures Review:  Reviewed     I have seen, personally fully evaluated, and discussed this patient with the CICU team.  This patient remains critically ill with cardiogenic shock, AKI, COPD,  and is at high risk for morbidity and mortality. I have spent 66 minutes of critical care time including repeated evaluation and examination of patient throughout the shift, including: review of hemodynamics, imaging, laboratory data, hemodynamic monitoring and management, lab and radiology review, medication review and management, fluid and electrolyte management, and other pertinent records in the ICU, as well as coordination of care with consulting teams.    Maylene Roes, APRN-NP   CICU  Pager [phone]  01/15/2019

## 2019-01-15 NOTE — Progress Notes
01/14/19 2258   Critical Care Vitals Adult   CVP (!) 25 MM HG   Hemodynamic Monitoring   PA Pressure (!) 49/17   PA Mean (mm Hg) (!) 31 mm Hg   Device Type Standard PA Catheter   CO (!) 3.8   Cardiac Output Method Indirect Fick Calculation   CI (!) 2.03   SV (Calculated) (!) 47   SVI (Calculated) (!) 25   SVR (Calculated) 926   SVRI (Calculated) 2049   RVSWI (Calculated) (!) 2   Team Notified of above results.     Dobutamine @ 36mcg/kg/min

## 2019-01-15 NOTE — Progress Notes
Patient arrived to room HC904 via cart accompanied by EMS. Patient transferred to the bed with assistance. Bedside safety checks completed. Initial patient assessment completed. Refer to flowsheet for details.    Admission skin assessment completed with: Arin, RN    Pressure injury present on arrival?: Yes    1. Head/Face/Neck: No  2. Trunk/Back: No  3. Upper Extremities: No  4. Lower Extremities: No  5. Pelvic/Coccyx: Yes  6. Assessed for device associated injury? Yes  7. Malnutrition Screening Tool (Nursing Nutrition Assessment) Completed? Yes

## 2019-01-15 NOTE — Procedures
Procedure Note - Arterial catheter insertion      Indication : Hemodynamic monitoring, frequent blood draws  Site: R radial artery  Prep: Chloraprep  Catheter: 20 gauge  Local anesthesia: 1% lidocaine, 0.5 ml local skin infiltration  Attempts: # 1  Waveform: Adequate dampened waveform  Procedure time: 10:40 PM  Pt tolerated procedure well, no complications.  Extremity pink and warm after completed.    Carmelina Noun,  Fellow, Cardiology  Pager: [phone]      ATTESTATION    Date of Service: 01/14/2019      Staff name:  Carmelina Noun, MD Date:  01/14/2019

## 2019-01-15 NOTE — Progress Notes
01/15/19 0409   Critical Care Vitals Adult   CVP (!) 25 MM HG   Hemodynamic Monitoring   PA Pressure (!) 48/29   PA Mean (mm Hg) (!) 36 mm Hg   Device Type Standard PA Catheter   CO 4.9   Cardiac Output Method Indirect Fick Calculation   CI (!) 2.62   SV (Calculated) 66   SVI (Calculated) 35   SVR (Calculated) (!) 740   SVRI (Calculated) (!) 1557   RVSWI (Calculated) 5   Team notified of above results.    Dobutamine @ 3

## 2019-01-15 NOTE — Progress Notes
Pharmacy Vancomycin Note  Subjective:   William Hurley "Barnabas Lister" Maiello is a 68 y.o. male being treated for empiric, lactic acidosis, leukocytosis, broad coverage.    Objective:     Current Vancomycin Orders   Medication Dose Route Frequency    vancomycin (VANCOCIN) 1,250 mg in sodium chloride 0.9% (NS) 275 mL IVPB  1,250 mg Intravenous ONCE    vancomycin, pharmacy to manage  1 each Service Per Pharmacy    vancomycin, random dosing  1 each Intravenous Random Dosing     Start Date of  vancomycin therapy: 01/14/2019  Additional Abx: cefepime    White Blood Cells   Date/Time Value Ref Range Status   01/14/2019 2002 13.9 (H) 4.5 - 11.0 K/UL Final     Creatinine   Date/Time Value Ref Range Status   01/14/2019 2002 3.45 (H) 0.4 - 1.24 MG/DL Final     Estimated CrCl:   22 mL/min  Actual Weight:  75.8 kg (167 lb)    Assessment:   Target levels for this patient:  Trough goal ~15    Plan:   1. Given significant AKI which continues to worsen, will give vanc 1250mg  once and then plan to do random dosing based on levels  2. Next scheduled level(s): plan for ~24 hours post first dose  3. Pharmacy will continue to monitor and adjust therapy as needed.    Kyung Rudd, [facility]  01/14/2019

## 2019-01-15 NOTE — Progress Notes
I saw this patient today on multidisciplinary rounds in conjunction with the bedside nurse and ICU rounding team. I have reviewed all pertinent data, including labs, radiology, vital signs, neurologic assessments, nursing notes, respiratory therapy data, intake and output, and consult notes and have devised a plan based on the aforementioned data. This patient is critically ill with dysfunction of at least one organ system and is at risk for life threatening deterioration necessitating complex medical decision making and ongoing provision of ICU care. ???  ???  Critical Care Time: 62 minutes  ???  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 He was found to be in cardiogenic shock at the OSH, and was transferred to [facility] for an increased level of care.   ???  Assessment & Plan:  Neuro: Intermittently agitated but following commands. More comfortable off BiPAP. Metabolic encephalopathy clearing. Low dose fentanyl for pain.   CV: Currently in profound cardiogenic shock with end-organ malperfusion requiring IABP therapy. His rhythm has not tolerated inotropes. NSVT improved with lidocaine and Amiodarone drips. His MvO2 has improved throughout the night, and he is maintaining his blood pressure off vasopressors. His urine output remains minimal. He is grossly volume overloaded on exam, by his PA-Catheter numbers, and on chest X-ray. His IABP is at 1:1 and likely cannot be weaned today. His recovery remains highly unlikely and we have therefore had discussions with the patient and his family regarding overall goals and clinical picture, they've opted for DNR, no AICD shocks, continue current therapy but no escalation.   Pulmonary: Switched from BiPAP to ComfortFlow, doing OK thusfar. Oxygenation is acceptable. Continue home umeclidinium-vilanterol  Renal: He is making minimal urine output despite high dose diuretics. His Creatinine has climbed again today. He clearly needs volume off for his respiratory status. We will not offer CRRT as we do not feel that it will affect his overall prognosis, which remains grim.   ID: No obvious infectious source but he is being broadly covered due to the severity of his illness.   GI: NPO for now.   Heme: Heparin infusion   Endo: LDSSI. Home levothyroxine.   ???  PPx:  - DVT: heparin drip  - GI: -  - VAP: -  - Bowel Regimen: senna/docusate, miralax  - Glucose Control: LDSSI  - IVF: -  - Electrolytes: Goal K > 4, Mg > 2  - Nutrition: NPO  - Lines/Drains/Airway: R IJ Introducer with PA-Catheter, R radial arterial line, R femoral arterial sheath with IABP, PIV, Foley    Goals of care discussion was had with the patient and his family this morning, decision made to continue current aggressive care but transition to DNR, no AICD shocks, no intubation, no dialysis. May escalate vasopressor therapy to maintain a MAP.

## 2019-01-15 NOTE — Response Teams
CARDIOGENIC SHOCK RESPONSE    NAME:William Hurley             MRN: [id number]             DOB:03-30-1951          AGE: 68 y.o.  ADMISSION DATE: 01/14/2019            LOS: 0 days    ACTIVATION/TEAM INFORMATION:  Activating Physician:Dr. [place]  Primary Team: Heart Failure  Time of Activation Page:2013  Time of Assessment: 2015    PATIENT INFORMATION:  Code Status: Full Code  Representative/DPOA Contact: Micael Hampshire    History of Present Illness: KHYLER Hurley is a 68 y.o. male with history of CAD s/p CABG in 2018 (LIMA to LAD; SVG to RCA/Intermediate/OM, complicated by 100% blockage in all SVG grafts, 100% mid LAD, L Circ, RCA, and 80% ostial L main), HFrEF (EF 15-20%), previous sustained VT, Biventricular 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     OSH reported a EF 10%, unable to tolerate inotrope therapies d/t ventricular ectopy. Prior to transfer, he had an episode of VT which he was shocked by his ICD, he was given amiodarone bolus and gtt. He was initiated on vasopressin for pressor support and transferred to [facility].      Summary of Current/Past Shock Therapies: Pressors and Inotropes  Medical History     Diagnosis Date Comment Source    CAD (coronary artery disease)   Provider    CAD (coronary artery disease), native coronary artery 04/14/2017  Provider    Chronic systolic heart failure (HCC) 04/14/2017  Provider    HFrEF (heart failure with reduced ejection fraction) (HCC)   Provider          ROS: assessment limited d/t patient condition  Musculoskeletal: generalized fatigue  Cardiovascular:   Respiratory: SOA    Physical Exam:   Heart: S1, S2, distance heart sounds, baseline COPD  Lung: decreased breath sounds bilaterally  Extremity: warm, cap refill 4secs    Pertinent Lab and EKG Findings: Troponin: 0.14  Lactate: 3.6  WBC: 13.9  Creatinine: 3.45  ALT: 658  Rhythm/ Rhythm Change:     Imaging Findings:   Echo Done: performed 9/10  EF: 15-20  Tamponade: No   Mitral Regurgitation: mild to mod  Aortic Insufficiency: trace  CTA Chest for PE: Not performed    Additional Team Members Involved (If Any): Dr. Pierre Bali, Dr. Shanon Brow (CTS), Dr. Micheline Rough (Interventional)  Brief Summary of Team Decision-making: With difficult exit strategies with ECMO, decision was made to persue cath lab for IABP placement and RHC. Ideally, if we can improve kidney function prior to [facility].     FINAL RECOMMENDATIONS: Activating cath lab for IABP, RHC       I spent 45 minutes managing the care of this patient. [name] remains critically ill with cardiogenic shock. Cares included: detailed exam, medication review and management, laboratory data review and interpretation, electrolyte management, review of available imaging and studies, and coordination of care with consulted teams.    Maylene Roes, APRN-NP  Cardiology Critical Care  Pager [phone]

## 2019-01-15 NOTE — Consults
Renal Consult    [facility]AN  Admission Date:  01/14/2019         Assessment and Plan     Principal Problem:    CHF (NYHA class IV, ACC/AHA stage D) (HCC)  Active Problems:    CAD (coronary artery disease), native coronary artery    ICD (implantable cardioverter-defibrillator) in place    Ventricular tachycardia (HCC)    Cardiogenic shock (HCC)    Acute renal failure (ARF) (HCC)        William Hurley is a 68 y.o. male with past medical history of CKD-III,  ischemic cardiomyopathy with reduced ejection fraction, was admitted for severe heart failure exacerbation and cardiogenic shock  Currently he is on 2 pressors, have intra-arterial balloon pump in place.  He is on Lasix drips, urine output has decreased.  Nephrology was consulted for hemodialysis needs.    Prerenal AKI  -Likely secondary to cardiorenal syndrome  -baseline cr around 1.7, 3.4 on presentation, cr plateud for now.  -He made 525 mils of urine with 200 mg  Lasix bolus +500 chlorothiazide and Lasix drip at 20 mils per hour  -He is currently in volume overload with bilateral pulmonary crackles  -In view of volume overload and inadequate urinary response to diuretics, would start him on CRRT with a goal of 2 L ultrafiltrate removal in next 24 hours.  -Plan discussed with patient, he agrees for the hemodialysis.  Wife is present bedside, agree with above plan.  -He currently does not have a line, plan communicated to primary team.  -He is currently on heparin drip, aspirin and Plavix.  Once he gets line would start him on CRRT.    Hyponatremia  -likely secondary to decrease solute intake and heart failure leading to decreased effective intraarterial volume.  -recommend dietery consult to meet nutritional requirements    HAGMA + NAGMA  -HAGMA due to AKI  -NAGMA likely 2/2 to hyporeninemic state in setting of ARB use/CKD  -continue to hold ARB in acute setting  -expect acidosis correction with CRRT    Microcytic anemia -recommend iron panel to assess iron stores and need for iron replacement and epogen in setting of CKD     Thank you for the consult, npehrology will continue to follow.    Patient is critically ill with very high mortality, morbidity and rapid clinical deterioration.      Dianna Rossetti, MD  Nephrology fellow, PGY 4  Pager [phone]  Also available on Voalte me and [employer]      History     Reason for Consult: Acute kidney injury, need for hemodialysis.    HPI: William Hurley is a 68 y.o. male with past medical history of ischemic cardiomyopathy status post ICD, hypertension, type 2 diabetes, stenosed left subclavian artery was brought in by her wife yesterday in view of severe heart failure exacerbation.  She reports he has been having worsening shortness of breath, decreased ambulatory capacity in last 1 month.  His p.o. intake has come down some significantly in the last 1 month.  He takes 40 mg of Lasix twice daily at home, reports his urine output has decreased in the last week or so.  She states he went to his doctor right after Labor Day when he was all swollen and received IV Lasix.  Usually P good amount of urine right after taking Lasix however the last time he received IV Lasix after Labor Day he did not produce much urine.  He was  subsequently discharged with close follow-up and has been following up with the heart doctor on weekly basis.  His shortness of breath worsened to the point that he ended up coming to the ER.    Last night he got right heart cath with placement of intra-arterial balloon pump.  His pulmonary artery pressure was 50/25, pulmonary Capillary wedge pressure was 23.  He has baseline CKD stage III with creatinine 1.5 range, he presented with creatinine of 3.45.  Overnight he received 500 mg of chlorothiazide, 200 mg of Lasix bolus, followed by a Lasix drip 20 mils per hour.  He had only made 525 mils of urine with overall 1 L positive. He was placed on low-dose dobutamine and vasopressin overnight.  He is being placed on BiPAP for respiratory distress.      Past Medical History   Medical History:   Diagnosis Date   ??? BPH (benign prostatic hyperplasia)    ??? CAD (coronary artery disease)    ??? CAD (coronary artery disease), native coronary artery 04/14/2017   ??? Chronic systolic heart failure (HCC) 04/14/2017   ??? COPD (chronic obstructive pulmonary disease) (HCC)    ??? Diabetes mellitus (HCC)    ??? Essential hypertension 04/14/2017   ??? Glaucoma    ??? HFrEF (heart failure with reduced ejection fraction) (HCC)    ??? HLD (hyperlipidemia) 04/14/2017   ??? Hx of myocardial infarction 1998, 2018   ??? ICD (implantable cardioverter-defibrillator) in place 04/14/2017   ??? Ischemic cardiomyopathy 04/14/2017   ??? Peripheral vascular disease (HCC)    ??? PVD (peripheral vascular disease) (HCC) 04/14/2017   ??? Type II diabetes mellitus (HCC) 04/14/2017   ??? Wears dentures     upper and lower       Surgical History:   Procedure Laterality Date   ??? CORONARY ARTERY BYPASS GRAFT  1995   ??? ICD PLACEMENT  05/2016   ??? ANGIOGRAPHY CORONARY ARTERY AND ARTERIAL/VENOUS GRAFTS WITH LEFT HEART CATHETERIZATION N/A 04/14/2017    Performed by Harley Alto, MD at [facility] CATH LAB   ??? PERCUTANEOUS CORONARY STENT PLACEMENT WITH ANGIOPLASTY N/A 04/14/2017    Performed by Harley Alto, MD at [facility] CATH LAB   ??? COLONOSCOPY     ??? TONSILLECTOMY      68 yrs old   ??? VASCULAR SURGERY Bilateral     femoral stents       Family History  Family history reviewed; non-contributory    Social History   Social History     Socioeconomic History   ??? Marital status: Married     Spouse name: Not on file   ??? Number of children: Not on file   ??? Years of education: Not on file   ??? Highest education level: Not on file   Occupational History   ??? Not on file   Tobacco Use   ??? Smoking status: Former Smoker     Types: Cigarettes   ??? Smokeless tobacco: Never Used   Substance and Sexual Activity   ??? Alcohol use: No Frequency: Never   ??? Drug use: No   ??? Sexual activity: Not on file   Other Topics Concern   ??? Not on file   Social History Narrative   ??? Not on file          Medications  MEDSalbuterol 0.5%, 2.5 mg, Inhalation, QID    And  ipratropium bromide, 0.5 mg, Inhalation, QID  aspirin EC, 81 mg, Oral, QDAY  cefepime  ( MAXIPIME ) IVPB,  2 g, Intravenous, Q24H*  clopiDOGrel, 75 mg, Oral, QDAY  fluticasone propionate, 2 spray, Each Nostril, BID  fluticasone propionate, 1 puff, Inhalation, QDAY    And  umeclidinium-vilanteroL, 1 puff, Inhalation, QDAY  heparin (porcine), 20-40 Units/kg, Intravenous, As Prescribed  insulin aspart U-100, 0-6 Units, Subcutaneous, ACHS (22)  levothyroxine, 25 mcg, Oral, QDAY 30 min before breakfast  linezolid  (ZYVOX) IVPB, 600 mg, Intravenous, Q12H*  polyethylene glycol 3350, 1 packet, Oral, QDAY  senna/docusate, 1 tablet, Oral, BID  sodium chloride 0.9% (NS), , , NOW  sodium chloride 0.9% (NS), , , NOW  vasopressin, , , NOW  water (sterile) for injection, , , NOW     IV MEDS  ??? amiodarone (CORDARONE) 360 mg in dextrose, iso-osm 200 mL infusion 1 mg/min (01/15/19 0800)   ??? BK 4/2.5 (PHOXILLUM) 5,000 mL dialysate     ??? BK 4/2.5 (PHOXILLUM) 5,000 mL replacement (post-filter) solution     ??? BK 4/2.5 (PHOXILLUM) 5,000 mL replacement (pre-filter) solution     ??? DOBUTamine (DOBUTREX) 1 g/D5W 250 mL infusion (std conc)(premade) Stopped (01/15/19 0806)   ??? furosemide (LASIX) 200 mg in dextrose 5% (D5W) 100 mL IV drip (std conc) 40 mg/hr (01/15/19 0743)   ??? heparin (porcine) 20,000 units/D5W 500 mL infusion (std conc)(premade) 1,137 Units/hr (01/15/19 0059)   ??? vasopressin (VASOSTRICT) 20 Units in sodium chloride 0.9% (NS) 100 mL IV drip (std conc) Stopped (01/14/19 2308)     Prn sodium chloride 0.9% (NS) PRN, sodium phosphate  IVPB PRN (On Call from Rx) **OR** sodium phosphate  IVPB PRN (On Call from Rx) **OR** sodium phosphate  IVPB PRN (On Call from Rx)     HOME MEDS  Prior to Admission Medications Prescriptions Last Dose Informant Patient Reported? Taking?   HYDROcodone/acetaminophen (NORCO) 5/325 mg tablet  Self Yes No   Sig: Take 1 tablet by mouth every 4 hours as needed for Pain   albuterol-ipratropium (DUO-NEB) 0.5 mg-3 mg(2.5 mg base)/3 mL nebulizer solution   Yes No   Sig: Inhale 3 mL by mouth into the lungs four times daily.   alirocumab (PRALUENT PEN) 75 mg/mL injectable PEN   No No   Sig: Inject 1 mL under the skin every 14 days.   amiodarone (CORDARONE) 200 mg tablet   No No   Sig: Take one tablet by mouth daily. Take with food.   aspirin EC 81 mg tablet  Self Yes No   Sig: Take 81 mg by mouth daily. Take with food.   atorvastatin (LIPITOR) 40 mg tablet  Self No No   Sig: Take one tablet by mouth at bedtime daily.   carvediloL (COREG) 3.125 mg tablet   No No   Sig: Take one tablet by mouth twice daily. Take with food.   clopiDOGrel (PLAVIX) 75 mg tablet  Outside Pharmacy Yes No   Sig: Take 75 mg by mouth daily.   fluticasone (FLONASE) 50 mcg/actuation nasal spray  Self Yes No   Sig: Apply 2 sprays to each nostril as directed as Needed. Shake bottle gently before using.    fluticasone-umeclidin-vilanter (TRELEGY ELLIPTA) 100-62.5-25 mcg dsdv   Yes No   Sig: Inhale 1 puff by mouth into the lungs twice daily.   furosemide (LASIX) 20 mg tablet   Yes No   Sig: Take 20 mg by mouth every morning.   glimepiride (AMARYL) 1 mg tablet   Yes No   Sig: Take 1 mg by mouth twice daily.   levothyroxine (SYNTHROID) 50  mcg tablet   No No   Sig: Take one-half tablet by mouth daily 30 minutes before breakfast.   nitroglycerin (NITROSTAT) 0.4 mg tablet   No No   Sig: Place one tablet under tongue every 5 minutes as needed.   potassium chloride (MICRO-K) 10 mEq capsule   Yes No   Sig: Take 10 mEq by mouth daily. Take with a meal and a full glass of water.   pramipexole (MIRAPEX) 0.125 mg tablet   Yes No   Sig: Take 0.125 mg by mouth three times daily.   tamsulosin (FLOMAX) 0.4 mg capsule   Yes No Sig: Take 0.4 mg by mouth daily. Do not crush, chew or open capsules. Take 30 minutes following the same meal each day.   telmisartan (MICARDIS) 20 mg tablet   Yes No   Sig: Take 10 mg by mouth daily.   traZODone (DESYREL) 50 mg tablet   Yes No   Sig: Take 50 mg by mouth at bedtime as needed for Sleep.      Facility-Administered Medications: None          Review of Systems  Constitutional: Fatigue, tired, decreased p.o. intake.  Eyes: negative  Ears, nose, mouth, throat, and face: negative  Respiratory: negative  Cardiovascular: negative  Gastrointestinal: negative  Genitourinary: Decreased urine production.  Integument/breast: negative  Hematologic/lymphatic: negative  Musculoskeletal:negative  Neurological: negative  Endocrine: negative    Wife reported frequent falls prior to presentation.        Physical Exam        Vital Signs: Last Filed In 24 Hours Vital Signs: 24 Hour Range   BP: 85/66 (09/11 2010)  Temp: 36.7 ???C (98 ???F) (09/12 0800)  Pulse: 68 (09/12 0700)  Respirations: 25 PER MINUTE (09/12 0700)  SpO2: 97 % (09/12 0600)  SpO2 Pulse: 67 (09/12 0700)  Height: 170.2 cm (67.01) (09/11 2000) BP: (85-193)/(66-150)   ABP: (113-130)/(40-53)   ART MAP (Calculated) mm Hg:  [49 mm Hg-62 mm Hg]   Temp:  [36.7 ???C (98 ???F)-36.7 ???C (98.1 ???F)]   Pulse:  [64-87]   Respirations:  [15 PER MINUTE-26 PER MINUTE]   SpO2:  [94 %-100 %]           Vitals:    01/14/19 2000   Weight: 75.8 kg (167 lb 1.7 oz)       Intake/Output Summary (Last 24 hours) at 01/15/2019 0829  Last data filed at 01/15/2019 0800  Gross per 24 hour   Intake 1498.58 ml   Output 565 ml   Net 933.58 ml        Gen: Alert and Oriented, He appears in mild respiratory distress, on BiPAP.  HEENT: Sclera normal; MMM  CV: no JVD, S1 and S2 normal, no rubs, murmurs or gallops   Pulm: Bilateral crackles heard up to middle lobes.  GI: BS+ x4, non-tender to palpation  Neuro: Grossly normal, moving all extremities, speech intact  Ext: no edema, clubbing or cyanosis Skin: no rash     Lines???he currently have right Swan cath, right arterial femoral sheath, Foley catheter.    Labs     Recent Labs     01/14/19  2002 01/15/19  0400   NA 131* 131*   K 5.4* 4.8   CL 94* 98   CO2 16* 16*   GAP 21* 17*   BUN 73* 77*   CR 3.45* 3.59*   GLU 117* 149*   CA 8.7 8.3*   ALBUMIN 2.9* 2.6*  MG 2.3 2.1   TSH 0.03*  --        Recent Labs     01/14/19  2002 01/15/19  0400   WBC 13.9* 12.4*   HGB 10.4* 9.3*   HCT 33.3* 29.7*   PLTCT 130* 102*   INR 2.5*  --    PTT 52.1*  --    AST 906* 719*   ALT 658* 577*   ALKPHOS 110 100   TNI 0.14* 0.11*      Estimated Creatinine Clearance: 21.4 mL/min (A) (based on SCr of 3.59 mg/dL (H)).  Vitals:    01/14/19 2000   Weight: 75.8 kg (167 lb 1.7 oz)      Recent Labs     01/14/19  2245 01/15/19  0400   PHART 7.30* 7.33*   PO2ART 152* 140*                           Radiology     Pertinent radiology reviewed.         Chest x-ray shows florid volume overload.

## 2019-01-16 ENCOUNTER — Encounter: Admit: 2019-01-16 | Discharge: 2019-01-16

## 2019-01-16 LAB — PTT (APTT): Lab: 80 s — ABNORMAL HIGH (ref 24.0–36.5)

## 2019-01-16 LAB — HEMOGLOBIN A1C: Lab: 6.4 % — ABNORMAL HIGH (ref 4.0–6.0)

## 2019-01-16 LAB — CULTURE-URINE W/SENSITIVITY

## 2019-01-17 ENCOUNTER — Encounter: Admit: 2019-01-17 | Discharge: 2019-01-17 | Payer: MEDICARE

## 2019-01-17 ENCOUNTER — Ambulatory Visit: Admit: 2019-01-17 | Discharge: 2019-01-18 | Payer: MEDICARE

## 2019-01-21 LAB — CULTURE-BLOOD W/SENSITIVITY

## 2019-02-03 NOTE — Death Pronouncement
Physician Death [place]                                                          Physician Pronouncing Death: Baldwin Crown, MD:   Pager [phone]    Date Death Pronounced: 19-Jan-2019    Time Death Pronounced: 10:05 PM    Service Attending Physician: Lise Auer     Service Resident Physician: Jasmine December    After identification of patient confirmed, death was pronounced on the basis of:   Patient unresponsive to verbal or tactile stimuli, No heart sounds heard or pulses palpated, No spontaneous breaths or chest movement and Pupils fixed and dilated, no pupillary light or corneal reflex    The following family or contacts were notified of patient's death:  Spouse, son, daughter    Comments: n/a    Autopsy:  Declined      REMINDER: Service Attending or Resident to sign [employer] Certificate of Death

## 2019-02-03 NOTE — Deceased Discharge Summary
Death Discharge Summary      Name: William Hurley  Medical Record Number: [id number]    Account Number:  [id number]  Date Of Birth:  1950/09/18                     Age:  68 years   Admit date:  2019/02/04                 Death Date: 10/03/2020Time: 10:05 PM    Attending Physician: No att. providers found   Service: Cardiology- Heart Failure Round- 2520    Physician Summary completed by: Rosaria Ferries, MD    Reason for hospitalization: Cardiogenic Shock    Significant PMH:   Medical History:   Diagnosis Date   ??? BPH (benign prostatic hyperplasia)    ??? CAD (coronary artery disease)    ??? CAD (coronary artery disease), native coronary artery 04/14/2017   ??? Chronic systolic heart failure (HCC) 04/14/2017   ??? COPD (chronic obstructive pulmonary disease) (HCC)    ??? Diabetes mellitus (HCC)    ??? Essential hypertension 04/14/2017   ??? Glaucoma    ??? HFrEF (heart failure with reduced ejection fraction) (HCC)    ??? HLD (hyperlipidemia) 04/14/2017   ??? Hx of myocardial infarction 1998, 2018   ??? ICD (implantable cardioverter-defibrillator) in place 04/14/2017   ??? Ischemic cardiomyopathy 04/14/2017   ??? Peripheral vascular disease (HCC)    ??? PVD (peripheral vascular disease) (HCC) 04/14/2017   ??? Type II diabetes mellitus (HCC) 04/14/2017   ??? Wears dentures     upper and lower       Allergies: Bee [venom-honey bee]    Admission Physical Exam notable for:    Neck Veins: JVP 14cm, positive HJR  Chest Inspection: left chest incision well healed  Cardiac Auscultation: Regular rhythm, S1, S2, no S3 or S4, no murmur  Radial Arteries: normal symmetric radial pulses  Pedal Pulses: pulses 2+, symmetric  Lower Extremity Edema: no   Abdominal Exam: soft, non-tender, bowel sounds normal, no hepatomegaly    Admission Lab/Radiology studies notable for:     Hemoglobin 10.4, Platelet Count 130, White Blood Cells 13.9, INR 2.5, Sodium 131, Potassium 5.4, Chloride 94, CO2 16, Anion Gap 21, BUN 73, Creatinine 3.45, Albumin 2.9, Lactic Acid 3.6, Total Bilirubin 3.3; AST 906, ALT 658, Alkaline Phosphatase 110, BNP 1,685, Troponin 0.14.    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     On admission he was placed on Dobutamine drip and Amiodarone drip, and an intraaortic balloon pump was placed by cardiothoracic surgery. A right heart catheterization was also performed, which showed elevated pulmonary artery pressures (mean PA pressure 34 mmHg), Elevated capillary wedge pressure (23 mmHg), and Low Cardiac Output and indices (3.1 and 1.66, respectively). His  Creatinine was found to be 3.45 with oliguria, resistant to maximal dose lasix drip as well as diuril. Dobutamine was quickly weaned off due to perisstent non sustained ventricular tachycardia, and a lidocaine drip was started. A vasopressin drip was also briefly started for mean arterial pressure below 65. Renal was consulted, however patient was too hemodynamically unstable to undergo emergent dialysis.    Admission laboratory findings also showed Mr. William Hurley was experiencing congestive hepatopathy, an elevated anion gap metabolic acidosis likely due to uremia and lactic acidosis, hypoxic respiratory failure requiring intermittent BiPAP, and severe malnutrition with albumin of 2.9. An upper extremity arterial and venous doppler ultrasound was performed which showed Severe extensive acute appearing occlusive deep and superficial venous thrombus involving the left subclavian, axillary, brachial and basilic veins. Mr. William Hurley was continued on heparin drip after intraaortic balloon pump placement as well.    After discussion with family at bedside, the decision was made to not escalate care further and to continue with antiarrythmic drips, intraaortic balloon pump, and as needed vasopressin. Unfortunately, he continued to have periods of non sustained ventricular tachycardia and had minimal urine output. With family at bedside, the decision was made to transition to comfort measures only. He then passed on 01-Feb-2019 late in the evening.    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

## 2019-02-03 DEATH — deceased

## 2019-09-06 IMAGING — CR CHEST
1 series · 1 of 1 positions shown · non-contrast
Comparison: none

[chest port x-wise]
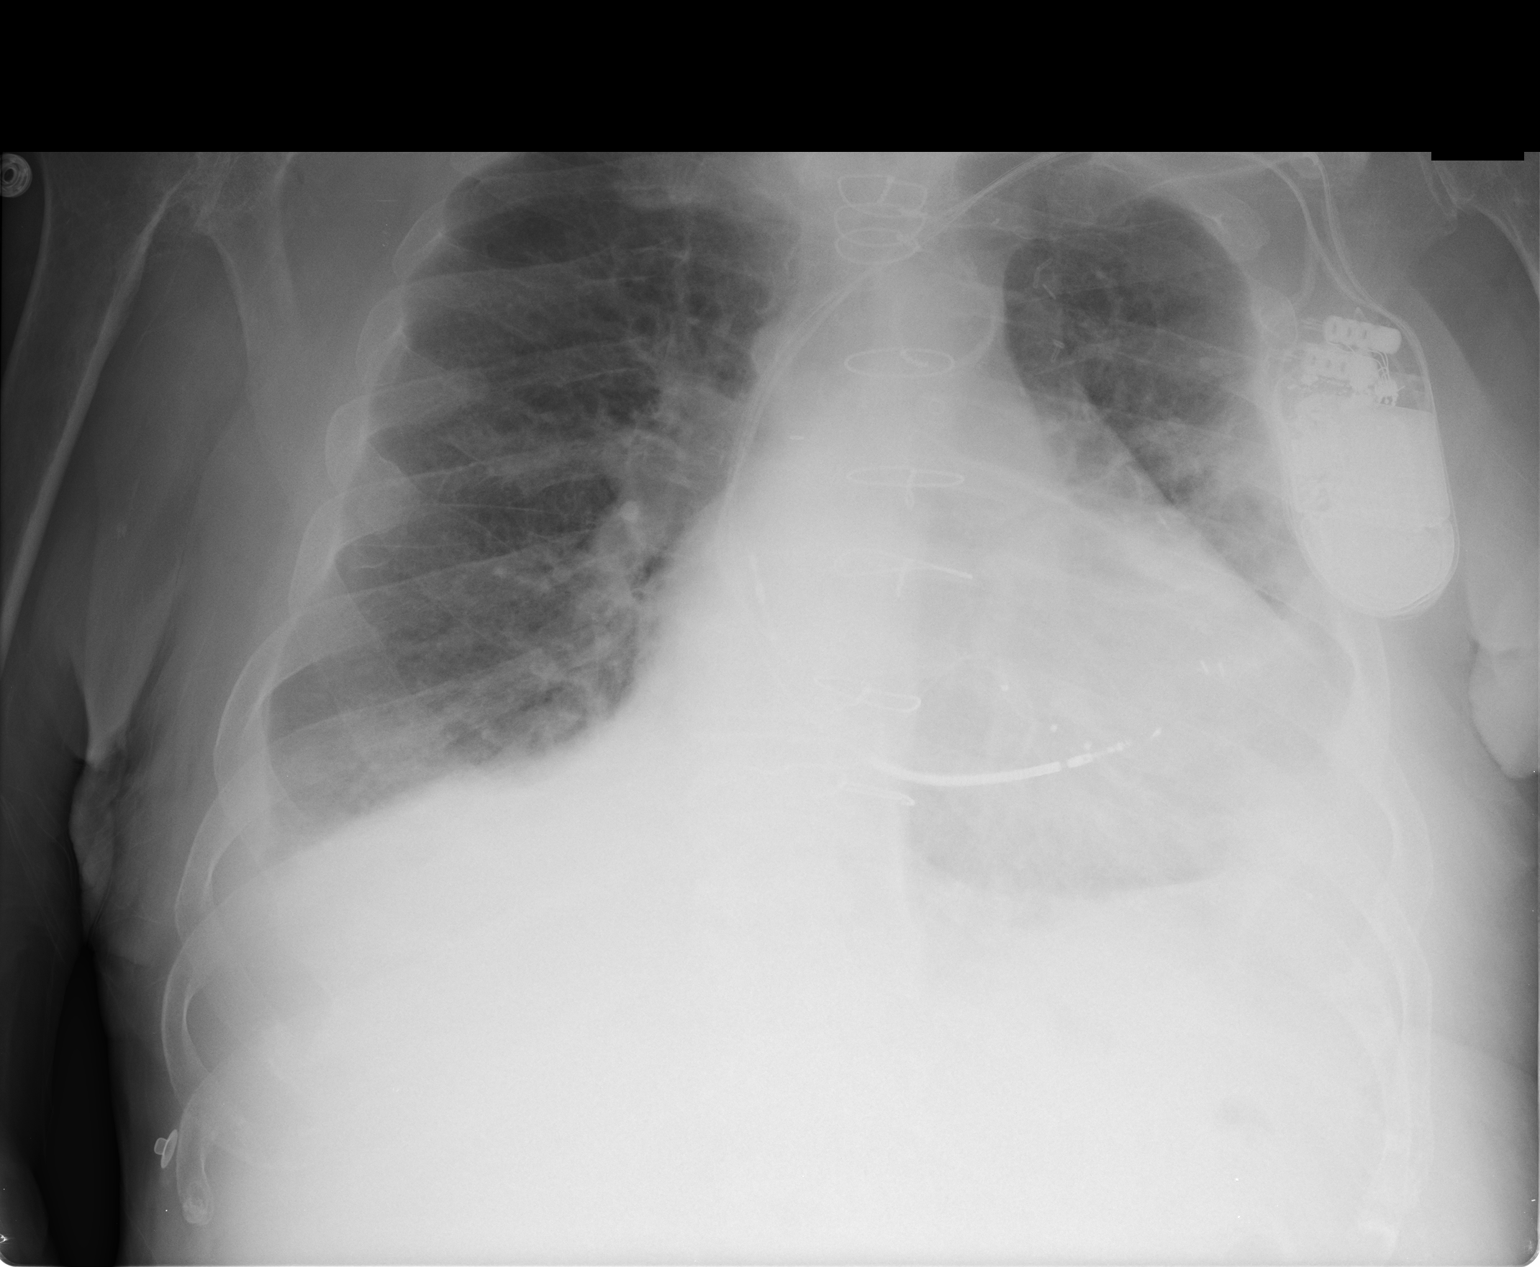

[1 of 1 positions shown; findings below may reference images not displayed]

EXAM

XR chest 1V

INDICATION

Shortness of breath

TECHNIQUE

Single view of the chest

COMPARISONS

11/26/18

FINDINGS

Small possible left pleural effusion with left basilar atelectasis. Small right pleural effusion.
Pulmonary alveolar opacity located within the periphery of the left midlung zone.

The cardiomediastinal silhouette is  mildly enlarged with postoperative changes to the chest. Left
anterior chest cardiac device.

The osseous structures are without an acute osseous abnormality. Severe degenerative change of the
right glenohumeral joint.

IMPRESSION
1. Small bilateral pleural effusions.
2. Possible infiltrate versus edema located within the periphery of the left midlung zone.

Tech Notes:

soa and cough. me

## 2020-12-21 ENCOUNTER — Encounter: Admit: 2020-12-21 | Discharge: 2020-12-21 | Payer: MEDICARE

## 2022-02-27 ENCOUNTER — Encounter: Admit: 2022-02-27 | Discharge: 2022-02-27 | Payer: MEDICARE
